# Patient Record
Sex: Male | Born: 1965 | Hispanic: No | Marital: Single | Smoking: Current some day smoker
Health system: Southern US, Community
[De-identification: ages and names within clinical notes are randomized; demographics above are authoritative.]

## PROBLEM LIST (undated history)

## (undated) DIAGNOSIS — I10 Essential (primary) hypertension: Secondary | ICD-10-CM

## (undated) HISTORY — PX: CERVICAL SPINE SURGERY: SHX589

## (undated) HISTORY — PX: HAND SURGERY: SHX662

---

## 2009-05-27 ENCOUNTER — Encounter: Admission: RE | Admit: 2009-05-27 | Discharge: 2009-05-27 | Payer: Self-pay | Admitting: Neurosurgery

## 2009-06-07 ENCOUNTER — Encounter: Admission: RE | Admit: 2009-06-07 | Discharge: 2009-06-07 | Payer: Self-pay | Admitting: Neurosurgery

## 2009-08-10 ENCOUNTER — Observation Stay (HOSPITAL_COMMUNITY): Admission: RE | Admit: 2009-08-10 | Discharge: 2009-08-11 | Payer: Self-pay | Admitting: Neurosurgery

## 2009-08-30 ENCOUNTER — Encounter: Admission: RE | Admit: 2009-08-30 | Discharge: 2009-08-30 | Payer: Self-pay | Admitting: Neurosurgery

## 2010-03-18 LAB — CBC
Hemoglobin: 17.1 g/dL — ABNORMAL HIGH (ref 13.0–17.0)
MCHC: 35.4 g/dL (ref 30.0–36.0)
MCV: 82.8 fL (ref 78.0–100.0)
Platelets: 221 10*3/uL (ref 150–400)
RBC: 5.83 MIL/uL — ABNORMAL HIGH (ref 4.22–5.81)
WBC: 7.3 10*3/uL (ref 4.0–10.5)

## 2010-03-18 LAB — BASIC METABOLIC PANEL
CO2: 26 mEq/L (ref 19–32)
Chloride: 109 mEq/L (ref 96–112)
Creatinine, Ser: 0.98 mg/dL (ref 0.4–1.5)
GFR calc non Af Amer: >60 mL/min (ref >60)
Potassium: 4.4 mEq/L (ref 3.5–5.1)
Sodium: 141 mEq/L (ref 135–145)

## 2011-05-01 ENCOUNTER — Ambulatory Visit: Payer: Self-pay | Admitting: Internal Medicine

## 2011-09-02 ENCOUNTER — Ambulatory Visit: Payer: Self-pay | Admitting: Internal Medicine

## 2012-11-12 ENCOUNTER — Ambulatory Visit: Payer: Self-pay | Admitting: Family

## 2012-11-29 ENCOUNTER — Ambulatory Visit: Payer: Self-pay | Admitting: Orthopedic Surgery

## 2013-04-16 ENCOUNTER — Emergency Department: Payer: Self-pay | Admitting: Internal Medicine

## 2013-04-17 ENCOUNTER — Ambulatory Visit: Payer: Self-pay | Admitting: Chiropractor

## 2013-06-05 ENCOUNTER — Emergency Department (HOSPITAL_COMMUNITY): Payer: Medicare Other

## 2013-06-05 ENCOUNTER — Encounter (HOSPITAL_COMMUNITY): Payer: Self-pay | Admitting: Emergency Medicine

## 2013-06-05 ENCOUNTER — Emergency Department (HOSPITAL_COMMUNITY)
Admission: EM | Admit: 2013-06-05 | Discharge: 2013-06-05 | Disposition: A | Discharge status: Home / Self Care | Payer: Medicare Other | Attending: Emergency Medicine | Admitting: Emergency Medicine

## 2013-06-05 DIAGNOSIS — Y929 Unspecified place or not applicable: Secondary | ICD-10-CM | POA: Insufficient documentation

## 2013-06-05 DIAGNOSIS — R5381 Other malaise: Secondary | ICD-10-CM | POA: Insufficient documentation

## 2013-06-05 DIAGNOSIS — F17200 Nicotine dependence, unspecified, uncomplicated: Secondary | ICD-10-CM | POA: Insufficient documentation

## 2013-06-05 DIAGNOSIS — R059 Cough, unspecified: Secondary | ICD-10-CM | POA: Diagnosis not present

## 2013-06-05 DIAGNOSIS — R079 Chest pain, unspecified: Secondary | ICD-10-CM | POA: Diagnosis not present

## 2013-06-05 DIAGNOSIS — Z79899 Other long term (current) drug therapy: Secondary | ICD-10-CM | POA: Diagnosis not present

## 2013-06-05 DIAGNOSIS — J3489 Other specified disorders of nose and nasal sinuses: Secondary | ICD-10-CM | POA: Diagnosis not present

## 2013-06-05 DIAGNOSIS — R5383 Other fatigue: Secondary | ICD-10-CM | POA: Diagnosis not present

## 2013-06-05 DIAGNOSIS — W57XXXA Bitten or stung by nonvenomous insect and other nonvenomous arthropods, initial encounter: Secondary | ICD-10-CM

## 2013-06-05 DIAGNOSIS — I10 Essential (primary) hypertension: Secondary | ICD-10-CM | POA: Diagnosis not present

## 2013-06-05 DIAGNOSIS — R05 Cough: Secondary | ICD-10-CM | POA: Insufficient documentation

## 2013-06-05 DIAGNOSIS — Y939 Activity, unspecified: Secondary | ICD-10-CM | POA: Insufficient documentation

## 2013-06-05 DIAGNOSIS — S00209A Unspecified superficial injury of unspecified eyelid and periocular area, initial encounter: Secondary | ICD-10-CM | POA: Insufficient documentation

## 2013-06-05 DIAGNOSIS — Z792 Long term (current) use of antibiotics: Secondary | ICD-10-CM | POA: Diagnosis not present

## 2013-06-05 HISTORY — DX: Essential (primary) hypertension: I10

## 2013-06-05 LAB — CBC WITH DIFFERENTIAL/PLATELET
Basophils Absolute: 0 10*3/uL (ref 0.0–0.1)
Basophils Relative: 1 % (ref 0–1)
EOS ABS: 0.4 10*3/uL (ref 0.0–0.7)
EOS PCT: 5 % (ref 0–5)
HCT: 42.1 % (ref 39.0–52.0)
HEMOGLOBIN: 14.7 g/dL (ref 13.0–17.0)
LYMPHS PCT: 35 % (ref 12–46)
Lymphs Abs: 3 10*3/uL (ref 0.7–4.0)
MCH: 29.2 pg (ref 26.0–34.0)
MCHC: 34.9 g/dL (ref 30.0–36.0)
MCV: 83.7 fL (ref 78.0–100.0)
MONO ABS: 0.6 10*3/uL (ref 0.1–1.0)
Monocytes Relative: 7 % (ref 3–12)
Neutro Abs: 4.5 10*3/uL (ref 1.7–7.7)
Neutrophils Relative %: 52 % (ref 43–77)
PLATELETS: 248 10*3/uL (ref 150–400)
RBC: 5.03 MIL/uL (ref 4.22–5.81)
RDW: 13.8 % (ref 11.5–15.5)
WBC: 8.6 10*3/uL (ref 4.0–10.5)

## 2013-06-05 LAB — I-STAT TROPONIN, ED
TROPONIN I, POC: 0.01 ng/mL (ref 0.00–0.08)
TROPONIN I, POC: 0 ng/mL (ref 0.00–0.08)

## 2013-06-05 LAB — BASIC METABOLIC PANEL
BUN: 13 mg/dL (ref 6–23)
CALCIUM: 9.3 mg/dL (ref 8.4–10.5)
CHLORIDE: 106 meq/L (ref 96–112)
CO2: 26 meq/L (ref 19–32)
CREATININE: 1 mg/dL (ref 0.50–1.35)
GFR calc non Af Amer: 89 mL/min — ABNORMAL LOW (ref >90)
GLUCOSE: 95 mg/dL (ref 70–99)
POTASSIUM: 3.7 meq/L (ref 3.7–5.3)
Sodium: 142 mEq/L (ref 137–147)

## 2013-06-05 MED ORDER — HYDROCHLOROTHIAZIDE 25 MG PO TABS
25.0000 mg | ORAL_TABLET | Freq: Once | ORAL | Status: AC
  Administered 2013-06-05: 25 mg via ORAL
  Filled 2013-06-05: qty 1

## 2013-06-05 MED ORDER — DOXYCYCLINE HYCLATE 100 MG PO CAPS: 100.0000 mg | ORAL_CAPSULE | Freq: Two times a day (BID) | ORAL | Status: DC

## 2013-06-05 MED ORDER — DOXYCYCLINE HYCLATE 100 MG PO TABS
100.0000 mg | ORAL_TABLET | Freq: Once | ORAL | Status: AC
  Administered 2013-06-05: 100 mg via ORAL
  Filled 2013-06-05: qty 1

## 2013-06-05 MED ORDER — BENZONATATE 100 MG PO CAPS
100.0000 mg | ORAL_CAPSULE | Freq: Once | ORAL | Status: AC
  Administered 2013-06-05: 100 mg via ORAL
  Filled 2013-06-05: qty 1

## 2013-06-05 NOTE — ED Notes (Signed)
Report given to Wendy, RN.

## 2013-06-05 NOTE — ED Notes (Signed)
Pt here today for c/o a tick on right eyelid. Found to have extremely high BP. Pt states that he has not taken his BP meds x 1 week because he was trying to find out which one might be giving him a cough. States he has had a sore throat, bilateral ear pain and cough x 69month. States he has had bodyaches, chest pain and occasional SOB over past 1 month but 'I just blew it off', thought it was probably my BP. States saw Dr. Harl Bowie in [place] 1 week ago but "I fired him".

## 2013-06-05 NOTE — Discharge Instructions (Signed)
°Emergency Department Resource Guide °1) Find a Doctor and Pay Out of Pocket °Although you won't have to find out who is covered by your insurance plan, it is a good idea to ask around and get recommendations. You will then need to call the office and see if the doctor you have chosen will accept you as a new patient and what types of options they offer for patients who are self-pay. Some doctors offer discounts or will set up payment plans for their patients who do not have insurance, but you will need to ask so you aren't surprised when you get to your appointment. ° °2) Contact Your Local Health Department °Not all health departments have doctors that can see patients for sick visits, but many do, so it is worth a call to see if yours does. If you don't know where your local health department is, you can check in your phone book. The CDC also has a tool to help you locate your state's health department, and many state websites also have listings of all of their local health departments. ° °3) Find a Walk-in Clinic °If your illness is not likely to be very severe or complicated, you may want to try a walk in clinic. These are popping up all over the country in pharmacies, drugstores, and shopping centers. They're usually staffed by nurse practitioners or physician assistants that have been trained to treat common illnesses and complaints. They're usually fairly quick and inexpensive. However, if you have serious medical issues or chronic medical problems, these are probably not your best option. ° °No Primary Care Doctor: °- Call [facility] at  832-8000 - they can help you locate a primary care doctor that  accepts your insurance, provides certain services, etc. °- Physician Referral Service- 1-800-533-3463 ° °Chronic Pain Problems: °Organization         Address  Phone   Notes  °[facility]  (336) 297-2271 Patients need to be referred by their primary care doctor.  ° °Medication  Assistance: °Organization         Address  Phone   Notes  °[place] [facility] [address] °[place], [place] [address] (336) 641-8030 --Must be a resident of [place] °-- Must have NO insurance coverage whatsoever (no Medicaid/ Medicare, etc.) °-- The pt. MUST have a primary care doctor that directs their care regularly and follows them in the community °  °[facility]  (866) 331-1348   °[employer]  (888) 892-1162   ° °Agencies that provide inexpensive medical care: °Organization         Address  Phone   Notes  °[facility]  (336) 832-8035   °[facility]    (336) 832-7272   °[facility] [address] °[place], [place] [address] (336) 832-4777   °[facility] [address], °[place] (336) 271-4999   °[facility]    (336) 373-0678   °[facility]    (336) 272-1050   °[facility] ° [address], [place] Phone:  (336) 832-4444, Fax:  (336) 832-4440 Hours of Operation:  9 am - 6 pm, M-F.  Also accepts Medicaid/Medicare and self-pay.  °[facility] ° [address], [place] Phone: (336) 832-3150, Fax: (336) [phone]. Hours of Operation:  8:30 am - 5:30 pm, M-F.  Also accepts Medicaid and self-pay.  °[facility] [place] [address], [place] Phone: (336) 878-6027   °[facility] [address], [place], [place] (336)723-1848, Ext. 123 Mondays & Thursdays: 7-9 AM.  First 15 patients are seen on a first come, first serve basis. °  ° °Medicaid-accepting [place] Providers: ° °Organization         Address  Phone   Notes  °[facility] [address], [place] (336) 641-2100 Also accepts self-pay patients.  °[facility] [address], [place] ° (336) 856-9996   °[facility] [address], [place]  (336) 288-8857   °[facility] [address] [place] [address], [place] (336) 299-7000   °Veita Bland [address], [place]  ° (336) 373-1557 Only accepts [place] Access Medicaid patients after they have their name applied to their card.  ° °Self-Pay (no insurance) in [place]: ° °Organization         Address  Phone   Notes  °Sickle Cell Patients, [facility] [address], [place] (336) 832-1970   °[facility] [address], [place] (336) 832-4400   °[facility] ° [address], [place] (336) 992-4800   °[facility]/Dr. Osei-Bonsu ° [address] [place] [address], [place] or [address], [place] (336) 841-8500 Phone number for both [place] and [place] locations is the same.  °[facility] [address], [place] (336) 299-0000   °[facility] [address] [place] [address], [place] or [address] (336) 852-7530 °(336) 878-2260   °[facility] [address], [place] (336) 350-1642, phone; (336) 294-5005, fax Sees patients 1st and 3rd Saturday of every month.  Must not qualify for public or private insurance (i.e. Medicaid, Medicare, [place] Health Choice, Veterans' Benefits) • Household income should be no more than 200% of the poverty level •The clinic cannot treat you if you are pregnant or think you are pregnant • Sexually transmitted diseases are not treated at the clinic.  ° ° °Dental Care: °Organization         Address  Phone  Notes  °[place] [facility] [address], [place] (336) 641-6152 Accepts children up to age 21 who are enrolled in Medicaid or [place] Health Choice; pregnant women with a Medicaid card; and children who have applied for Medicaid or [place] Health Choice, but were declined, whose parents can pay a reduced fee at time of service.  °[place]  [facility] [place]  [address], [place] (336) 641-7733 Accepts children up to age 21 who are enrolled in Medicaid or [place] Health Choice; pregnant women with a Medicaid card; and children who have applied for Medicaid or [place] Health Choice, but were declined, whose parents can pay a reduced fee at time of service.  °[facility] ° [address], [place] (336) 641-4533 Patients are seen by appointment only. Walk-ins are not accepted. [facility] will see patients 18 years of age and older. °Monday - Tuesday (8am-5pm) °Most Wednesdays (8:30-5pm) °$30 per visit, cash only  °[facility] ° [address], [place] (336) 641-4533 Patients are seen by appointment only. Walk-ins are not accepted. [facility] will see patients 18 years of age and older. °One   Wednesday Evening (Monthly: Volunteer Based).  $30 per visit, cash only  °[facility]  (919) 537-3737 for adults; Children under age 4, call Graduate Pediatric Dentistry at ([phone]) [phone]. Children aged 4-14, please call (919) [phone] to request a pediatric application. ° Dental services are provided in all areas of dental care including fillings, crowns and bridges, complete and partial dentures, implants, gum treatment, root canals, and extractions. Preventive care is also provided. Treatment is provided to both adults and children. °Patients are selected via a lottery and there is often a waiting list. °  °[facility] [address], °[place] ° (336) 763-8833 [email or web address] °  °[facility] [address], [place], [place] (336)723-1848, Ext. 123 Second and Fourth Thursday of each month, opens at 6:30 AM; Clinic ends at 9 AM.  Patients are seen on a first-come first-served basis, and a limited number are seen during each clinic.  ° °[facility] ° [address], [place], [place] (336) 723-7904    Eligibility Requirements °You must have lived in [place], [place], or [place] counties for at least the last three months. °  You cannot be eligible for state or federal sponsored healthcare insurance, including Veterans Administration, Medicaid, or Medicare. °  You generally cannot be eligible for healthcare insurance through your employer.  °  How to apply: °Eligibility screenings are held every Tuesday and Wednesday afternoon from 1:00 pm until 4:00 pm. You do not need an appointment for the interview!  °[facility] [address], [place], [place] 336-631-2330   °[facility]  336-342-8273   °[facility]  336-703-3100   °[facility]  336-570-6415   ° °Behavioral Health Resources in the Community: °Intensive Outpatient Programs °Organization         Address  Phone  Notes  °[facility] [address], [place], [place] 336-878-6098   °[facility] [address], [place], [place] 336-832-9800   °[facility] [address], [place], [place] ° 336-882-2125   °[facility] [address],  °[place], [place] 1-800-853-5163 or 336-641-4981   °Substance Abuse Resources °Organization         Address  Phone  Notes  °[facility]  336-882-2125   °[facility]  336-784-9470   °[facility]  336-285-9073   °[facility]  336-845-3988   °Residential & Outpatient Substance Abuse Program  1-800-659-3381   °Psychological Services °Organization         Address  Phone  Notes  °[facility]  336- 832-9600   °[facility]  336- 378-7881   °[facility] [address], [place] 1-800-853-5163 or 336-641-4981   ° °Mobile Crisis Teams °Organization         Address  Phone  Notes  °[facility], Mobile Crisis Care Unit  1-877-626-1772   °Assertive °[facility] ° [address]  [place], [place] 336-834-9664   °Sharon DeEsch [address] °[place] 336-554-5454   ° °Self-Help/Support Groups °Organization         Address  Phone             Notes  °[facility] of  - variety of support groups  336- 373-1402 Call for more information  °Narcotics Anonymous (NA), [facility] [address], °[place]  2 meetings at this location  ° °  Residential Treatment Programs Organization         Address  Phone  Notes  [facility] [address],    [place]  [phone]   [facility]  [address], [place] [phone], [place], [place] [phone]   [facility] [address], [place] [phone] Admissions: 8am-3pm M-F  [facility] [address]    [employer] [place], [place] 841-324-4010(989)[phone]   [facility] [address], [place], [place] [phone]   [facility] [address]  [place], [place] [phone]   [facility] Intensive Outpatient [address], [place], [place] [phone]   [facility] ([facility] [address]  [place], [place] [phone] or (425) [phone]   [facility] [address] [place], [place] [phone] Accepts Medicaid  [facility] [address]  [place] [phone] Substance Abuse/Addiction Treatment   [facility]         Address  Phone  Notes  [facility]  737-654-7455(888) 956-645-1729   Angie FavaJulie Brannon, PhD [address], [address] [place], [place]   [phone](336) [phone] or [phone](336) [phone]   [facility]   [address] [place], [place] ([phone]) [phone](336) [phone]   [facility] [address], [place], [place] ([phone](336) [phone] Insurance/Medicaid/sponsorship through [facility]  [facility] [address]                                    [place], [place] [phone]) [phone] Therapy/tele-psych/case    [facility] [address]   [place], [place] [phone](336) [phone]    Dr. Lolly MustacheArfeen  [phone](336) [phone]   [facility]  [employer] [facility] 1) [address], [place] 2) [address], [place] 3)  [address], [place] [phone](336) [phone](336) [phone]  [phone](336) [phone]   [facility] Child Abuse Hotline [phone](336) 816-592-6400 or [phone](336) [phone] (After Hours)       Chest Pain (Nonspecific) It is often hard to give a specific diagnosis for the cause of chest pain. There is always a chance that your pain could be related to something serious, such as a heart attack or a blood clot in the lungs. You need to follow up with your caregiver for further evaluation. CAUSES   Heartburn.  Pneumonia or bronchitis.  Anxiety or stress.  Inflammation around your heart (pericarditis) or lung (pleuritis or pleurisy).  A blood clot in the lung.  A collapsed lung (pneumothorax). It can develop suddenly on its own (spontaneous pneumothorax) or from injury (trauma) to the chest.  Shingles infection (herpes zoster virus). The chest wall is composed of bones, muscles, and cartilage. Any of these can be the source of the pain.  The bones can be bruised by injury.  The muscles or cartilage can be strained by coughing or overwork.  The cartilage can be affected by inflammation and become sore (costochondritis). DIAGNOSIS  Lab tests or other studies, such as X-rays, electrocardiography, stress testing, or cardiac imaging, may be needed to find the cause of your pain.  TREATMENT   Treatment depends on what may be causing your chest pain. Treatment may include:  Acid blockers for heartburn.  Anti-inflammatory medicine.  Pain medicine for inflammatory conditions.  Antibiotics if an infection is present.  You may  be advised to change lifestyle habits. This includes stopping smoking and avoiding alcohol, caffeine, and chocolate.  You may be advised to keep your  head raised (elevated) when sleeping. This reduces the chance of acid going backward from your stomach into your esophagus.  Most of the time, nonspecific chest pain will improve within 2 to 3 days with rest and mild pain medicine. HOME CARE INSTRUCTIONS   If antibiotics were prescribed, take your antibiotics as directed. Finish them even if you start to feel better.  For the next few days, avoid physical activities that bring on chest pain. Continue physical activities as directed.  Do not smoke.  Avoid drinking alcohol.  Only take over-the-counter or prescription medicine for pain, discomfort, or fever as directed by your caregiver.  Follow your caregiver's suggestions for further testing if your chest pain does not go away.  Keep any follow-up appointments you made. If you do not go to an appointment, you could develop lasting (chronic) problems with pain. If there is any problem keeping an appointment, you must call to reschedule. SEEK MEDICAL CARE IF:   You think you are having problems from the medicine you are taking. Read your medicine instructions carefully.  Your chest pain does not go away, even after treatment.  You develop a rash with blisters on your chest. SEEK IMMEDIATE MEDICAL CARE IF:   You have increased chest pain or pain that spreads to your arm, neck, jaw, back, or abdomen.  You develop shortness of breath, an increasing cough, or you are coughing up blood.  You have severe back or abdominal pain, feel nauseous, or vomit.  You develop severe weakness, fainting, or chills.  You have a fever. THIS IS AN EMERGENCY. Do not wait to see if the pain will go away. Get medical help at once. Call your local emergency services (911 in [place] Do not drive yourself to the hospital. MAKE SURE YOU:   Understand these instructions.  Will watch your condition.  Will get help right away if you are not doing well or get worse. Document Released: 09/28/2004 Document  Revised: 03/13/2011 Document Reviewed: 07/25/2007 [facility] Patient Information 2014 [place], [place].  Tick Bite Information Ticks are insects that attach themselves to the skin and draw blood for food. There are various types of ticks. Common types include wood ticks and deer ticks. Most ticks live in shrubs and grassy areas. Ticks can climb onto your body when you make contact with leaves or grass where the tick is waiting. The most common places on the body for ticks to attach themselves are the scalp, neck, armpits, waist, and groin. Most tick bites are harmless, but sometimes ticks carry germs that cause diseases. These germs can be spread to a person during the tick's feeding process. The chance of a disease spreading through a tick bite depends on:   The type of tick.  Time of year.   How long the tick is attached.   Geographic location.  HOW CAN YOU PREVENT TICK BITES? Take these steps to help prevent tick bites when you are outdoors:  Wear protective clothing. Long sleeves and long pants are best.   Wear white clothes so you can see ticks more easily.  Tuck your pant legs into your socks.   If walking on a trail, stay in the middle of the trail to avoid brushing against bushes.  Avoid walking through areas with long grass.  Put insect repellent on all exposed skin and along boot tops, pant  legs, and sleeve cuffs.   Check clothing, hair, and skin repeatedly and before going inside.   Brush off any ticks that are not attached.  Take a shower or bath as soon as possible after being outdoors.  WHAT IS THE PROPER WAY TO REMOVE A TICK? Ticks should be removed as soon as possible to help prevent diseases caused by tick bites. 1. If latex gloves are available, put them on before trying to remove a tick.  2. Using fine-point tweezers, grasp the tick as close to the skin as possible. You may also use curved forceps or a tick removal tool. Grasp the tick as close to its  head as possible. Avoid grasping the tick on its body. 3. Pull gently with steady upward pressure until the tick lets go. Do not twist the tick or jerk it suddenly. This may break off the tick's head or mouth parts. 4. Do not squeeze or crush the tick's body. This could force disease-carrying fluids from the tick into your body.  5. After the tick is removed, wash the bite area and your hands with soap and water or other disinfectant such as alcohol. 6. Apply a small amount of antiseptic cream or ointment to the bite site.  7. Wash and disinfect any instruments that were used.  Do not try to remove a tick by applying a hot match, petroleum jelly, or fingernail polish to the tick. These methods do not work and may increase the chances of disease being spread from the tick bite.  WHEN SHOULD YOU SEEK MEDICAL CARE? Contact your health care provider if you are unable to remove a tick from your skin or if a part of the tick breaks off and is stuck in the skin.  After a tick bite, you need to be aware of signs and symptoms that could be related to diseases spread by ticks. Contact your health care provider if you develop any of the following in the days or weeks after the tick bite:  Unexplained fever.  Rash. A circular rash that appears days or weeks after the tick bite may indicate the possibility of Lyme disease. The rash may resemble a target with a bull's-eye and may occur at a different part of your body than the tick bite.  Redness and swelling in the area of the tick bite.   Tender, swollen lymph glands.   Diarrhea.   Weight loss.   Cough.   Fatigue.   Muscle, joint, or bone pain.   Abdominal pain.   Headache.   Lethargy or a change in your level of consciousness.  Difficulty walking or moving your legs.   Numbness in the legs.   Paralysis.  Shortness of breath.   Confusion.   Repeated vomiting.  Document Released: 12/17/1999 Document Revised:  10/09/2012 Document Reviewed: 05/29/2012 [facility] Patient Information 2014 [place], [place].

## 2013-06-05 NOTE — ED Notes (Signed)
Phlebotomy at bedside.

## 2013-06-05 NOTE — ED Provider Notes (Signed)
CSN: [id number]     Arrival date & time 06/05/13  1407 History   First MD Initiated Contact with Patient 06/05/13 1519     Chief Complaint  Patient presents with  . Hypertension  . Tick Removal     (Consider location/radiation/quality/duration/timing/severity/associated sxs/prior Treatment) Patient is a 48 y.o. male presenting with general illness. The history is provided by the patient.  Illness Severity:  Moderate Onset quality:  Gradual Timing:  Constant Progression:  Unchanged Chronicity:  New Associated symptoms: chest pain (chest soreness. especially after days of heavy working. non-radiating. no associated n/v or diaphoresis. no sob), congestion, cough, fatigue, headaches (mild frontal) and rhinorrhea   Associated symptoms: no abdominal pain, no diarrhea, no fever, no loss of consciousness, no nausea, no rash, no shortness of breath and no vomiting     48 yo male with tick to right eyelid. Noticed today. Coming to ED for removal.  On further discussion, patient with generalized fatigue, dry cough, sore throat, and b/l ear pain x1 month. Waxing and waning. Took himself off antihypertensives 1 week ago to see if it would help.  Does endorse chest soreness over past couple weeks. Worse with palpation and twisting motion. Not worse with exertion. Denies any  SOB to me.  Patient states that finding the tick today reminded him of removing tick from left chest wall about one month ago. Removed entirely  (including head). States that maybe these symptoms are from that.  States he plans to go back on antihypertensives today.  No palpitations. No edema.     Past Medical History  Diagnosis Date  . Hypertension    Past Surgical History  Procedure Laterality Date  . Cervical spine surgery    . Hand surgery     No family history on file. History  Substance Use Topics  . Smoking status: Current Some Day Smoker  . Smokeless tobacco: Not on file  . Alcohol Use: No    Review of  Systems  Constitutional: Positive for fatigue. Negative for fever and chills.  HENT: Positive for congestion and rhinorrhea.   Eyes: Negative for pain and visual disturbance.  Respiratory: Positive for cough. Negative for shortness of breath.   Cardiovascular: Positive for chest pain (chest soreness. especially after days of heavy working. non-radiating. no associated n/v or diaphoresis. no sob). Negative for palpitations and leg swelling.  Gastrointestinal: Negative for nausea, vomiting, abdominal pain and diarrhea.  Genitourinary: Negative for flank pain and difficulty urinating.  Musculoskeletal: Negative for back pain.  Skin: Negative for color change and rash.  Neurological: Positive for headaches (mild frontal). Negative for dizziness and loss of consciousness.  All other systems reviewed and are negative.     Allergies  Review of patient's allergies indicates no known allergies.  Home Medications   Prior to Admission medications   Medication Sig Start Date End Date Taking? Authorizing Provider  amLODipine (NORVASC) 5 MG tablet Take 5 mg by mouth daily. 05/06/13  Yes Historical Provider, MD  hydrochlorothiazide (HYDRODIURIL) 25 MG tablet Take 25 mg by mouth daily.   Yes Historical Provider, MD  KLOR-CON 10 10 MEQ tablet Take 10 tablets by mouth daily. 05/12/13  Yes Historical Provider, MD  metoprolol succinate (TOPROL-XL) 100 MG 24 hr tablet Take 100 mg by mouth daily. 05/06/13  Yes Historical Provider, MD  doxycycline (VIBRAMYCIN) 100 MG capsule Take 1 capsule (100 mg total) by mouth 2 (two) times daily. One po bid x7 days 06/05/13   Stevie Kernyan Zaul Hubers, MD  BP 179/109  Pulse 76  Temp(Src) 98.8 F (37.1 C) (Oral)  Resp 17  SpO2 98% Physical Exam  Nursing note and vitals reviewed. Constitutional: He is oriented to person, place, and time. He appears well-developed and well-nourished. No distress.  HENT:  Head: Normocephalic and atraumatic. Head is without right periorbital erythema  and without left periorbital erythema.  Right Ear: Tympanic membrane, external ear and ear canal normal. No tenderness. No foreign bodies. No mastoid tenderness.  Left Ear: Tympanic membrane, external ear and ear canal normal. No tenderness. No foreign bodies. No mastoid tenderness.  Nose: Nose normal.  Mouth/Throat: Uvula is midline, oropharynx is clear and moist and mucous membranes are normal. No trismus in the jaw. No uvula swelling. No posterior oropharyngeal erythema or tonsillar abscesses.  Eyes: Conjunctivae and EOM are normal. Pupils are equal, round, and reactive to light. Right eye exhibits no discharge. Left eye exhibits no discharge.    Neck: No tracheal deviation present.  Cardiovascular: Normal heart sounds and intact distal pulses.   Pulmonary/Chest: Effort normal and breath sounds normal. No stridor. No respiratory distress. He has no wheezes. He has no rales.  Abdominal: Soft. He exhibits no distension. There is no tenderness. There is no guarding.  Musculoskeletal: He exhibits no edema and no tenderness.  Neurological: He is alert and oriented to person, place, and time. He has normal strength. No cranial nerve deficit or sensory deficit. Coordination and gait normal. GCS eye subscore is 4. GCS verbal subscore is 5. GCS motor subscore is 6.  Skin: Skin is warm and dry.  Psychiatric: He has a normal mood and affect. His behavior is normal.    ED Course  Procedures (including critical care time) Labs Review Labs Reviewed  BASIC METABOLIC PANEL - Abnormal; Notable for the following:    GFR calc non Af Amer 89 (*)    All other components within normal limits  CBC WITH DIFFERENTIAL  I-STAT TROPOININ, ED  Rosezena Sensor, ED    Imaging Review Dg Chest 2 View  06/05/2013   CLINICAL DATA:  Take by.  Cough.  EXAM: CHEST  2 VIEW  COMPARISON:  08/03/2009  FINDINGS: Mild cardiomegaly. Clear lungs. Normal lung volumes. No pneumothorax or pleural effusion.  IMPRESSION: No active  cardiopulmonary disease.   Electronically Signed   By: Maryclare Bean M.D.   On: 06/05/2013 16:46     EKG Interpretation None       Date: 06/05/2013  Rate: 65  Rhythm: normal sinus rhythm  QRS Axis: normal  Intervals: normal  ST/T Wave abnormalities: nonspecific T wave changes  Conduction Disutrbances:none  Narrative Interpretation:   Old EKG Reviewed: none available    MDM   Final diagnoses:  Fatigue  Chest pain  Tick bite    48 yo male with tick to eye lid.  Also with hypertension after self discontinuation of antihypertensives.  Well appearing on exam. Initial BP >200 systolic. Improved on own to 170's systolic on my exam. Later increased so given home dose of HCTZ. Improved after. Without evidence of end organ damage. Neuro intact. Did endorse some chest discomfort that sound musculoskeletal. Not exertional. Low suspicion for ACS. Troponin and delta troponin negative.  Given multiple tick exposure and generalized fatigue and other vague complaints. Treat with doxy for possible tick borne illness.  To restart BP meds. Patient in agreement.  Patient discharged home. Return precautions given. To follow up with pcp. patient in agreement with plan.   Discussed case with Dr. Micheline Maze  who is in agreement with assessment and plan.    Stevie Kern, MD 06/06/13 [phone]

## 2013-06-05 NOTE — ED Notes (Signed)
Roxy Cedar, MD notified re: BP readings

## 2013-06-05 NOTE — ED Notes (Signed)
MD at bedside to remove tick. 

## 2013-06-06 NOTE — ED Provider Notes (Signed)
Medical screening examination/treatment/procedure(s) were conducted as a shared visit with resident-physician practitioner(s) and myself.  I personally evaluated the patient during the encounter.  Pt is a 48 y.o. male with pmhx as above presenting with tick on R eyelid, as well as HTN and multiple vague systemic complaints including several weeks of intermittent CP.  He has recently chose to stop his BP meds to see if symptoms improved. Pt found to have no ischemic EKG changes, negative delta trop, nml Cr.  Tick removed by Dr. Sheilah Mins.  Pt instructed to restart home meds, f/u closely w/ PCP. As this is 2nd tick in past month pt has removed and multiple systemic complaints with treat prophylactically fro RMSF w/ doxy.     EKG Interpretation  Date/Time:  Thursday June 05 2013 15:59:57 EDT Ventricular Rate:  65 PR Interval:  158 QRS Duration: 96 QT Interval:  411 QTC Calculation: 427 R Axis:   59 Text Interpretation:  Sinus rhythm Probable left ventricular hypertrophy No prior fro comparison Confirmed by DOCHERTY  MD, MEGAN ([id number]) on 06/06/2013 3:32:48 PM        Shanna Cisco, MD 06/06/13 1536

## 2015-06-05 IMAGING — CR DG CHEST 2V
2 series · 2 of 2 positions shown · non-contrast
Comparison: 08/03/2009

CLINICAL DATA: Take by.  Cough.

EXAM:
CHEST  2 VIEW

[w chest pa]
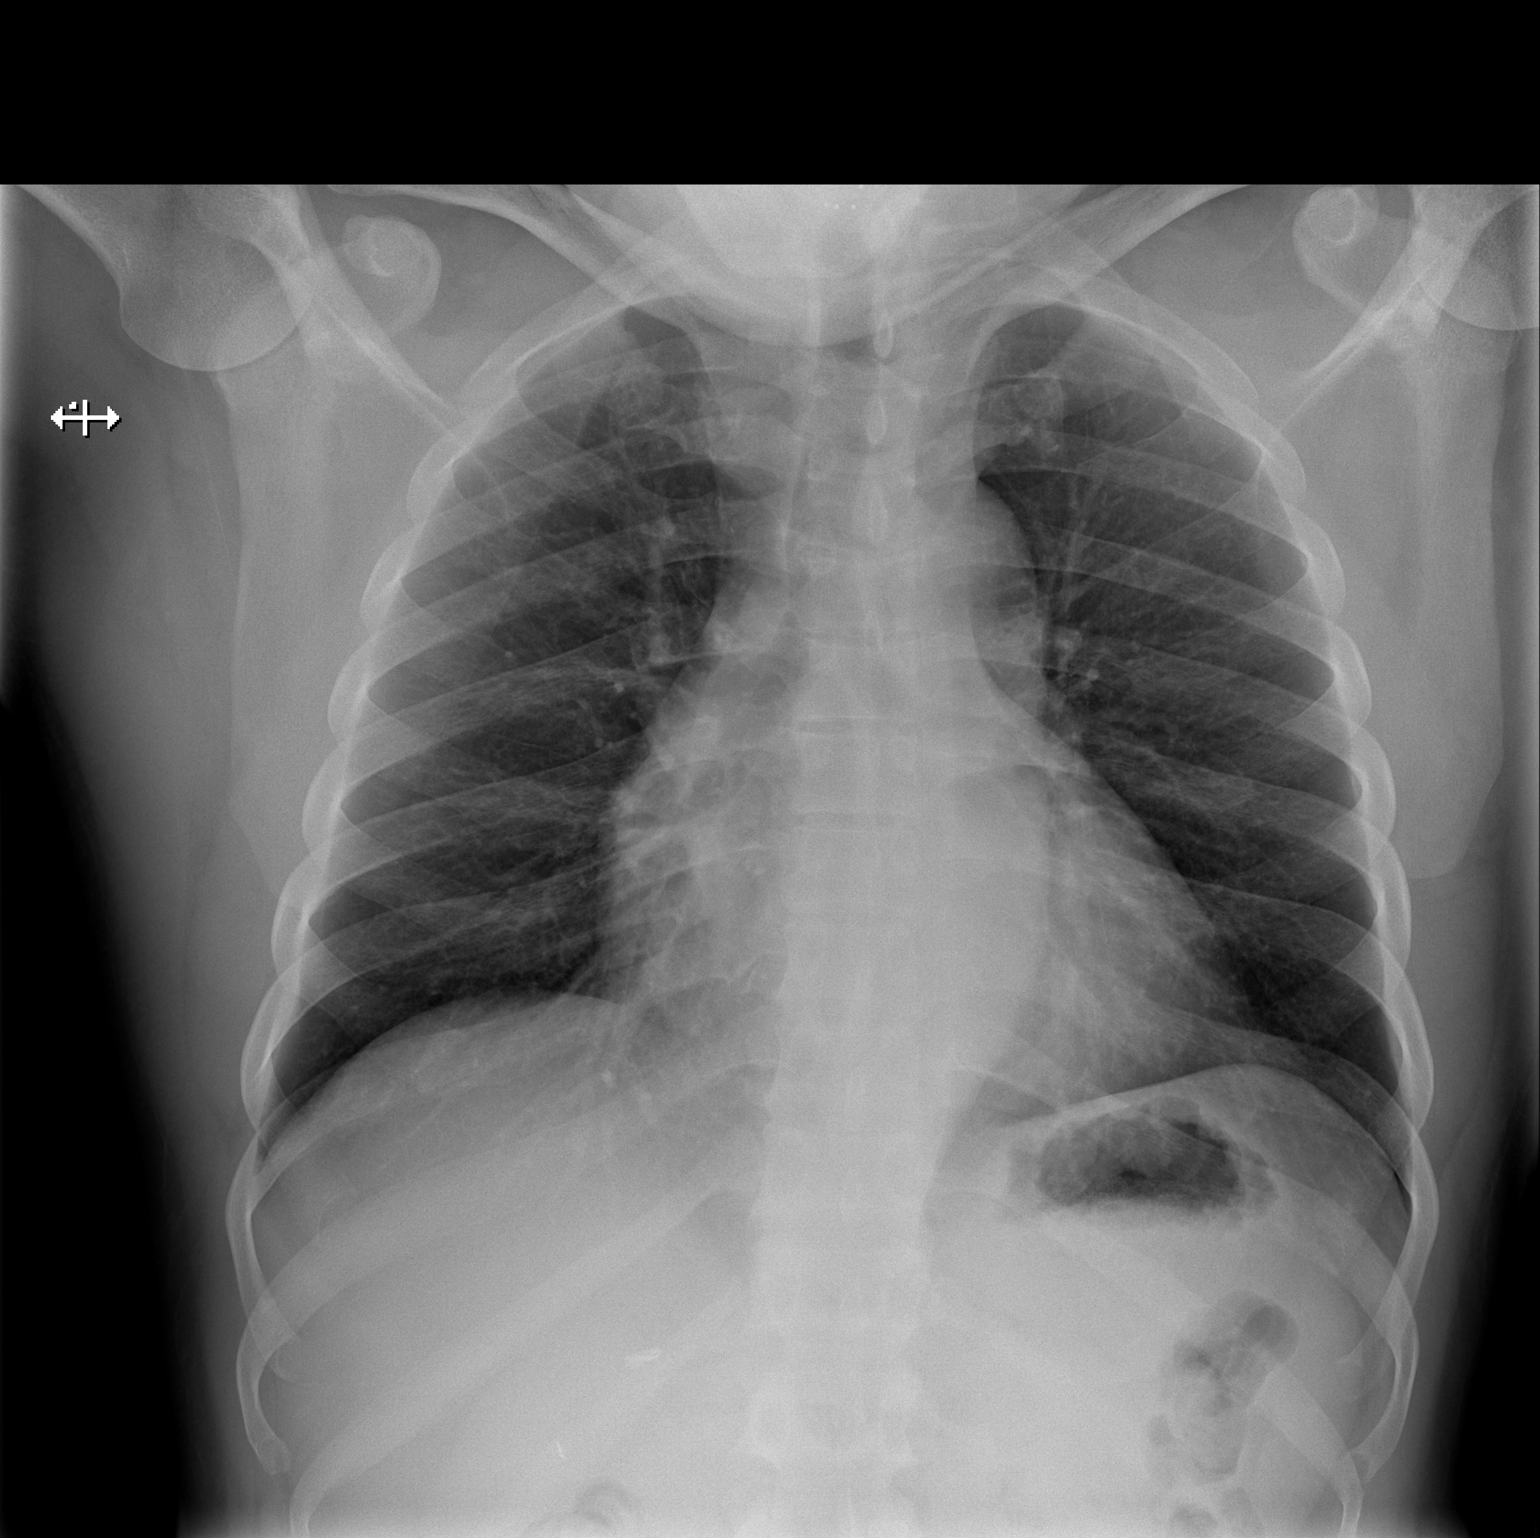

[w chest lat]
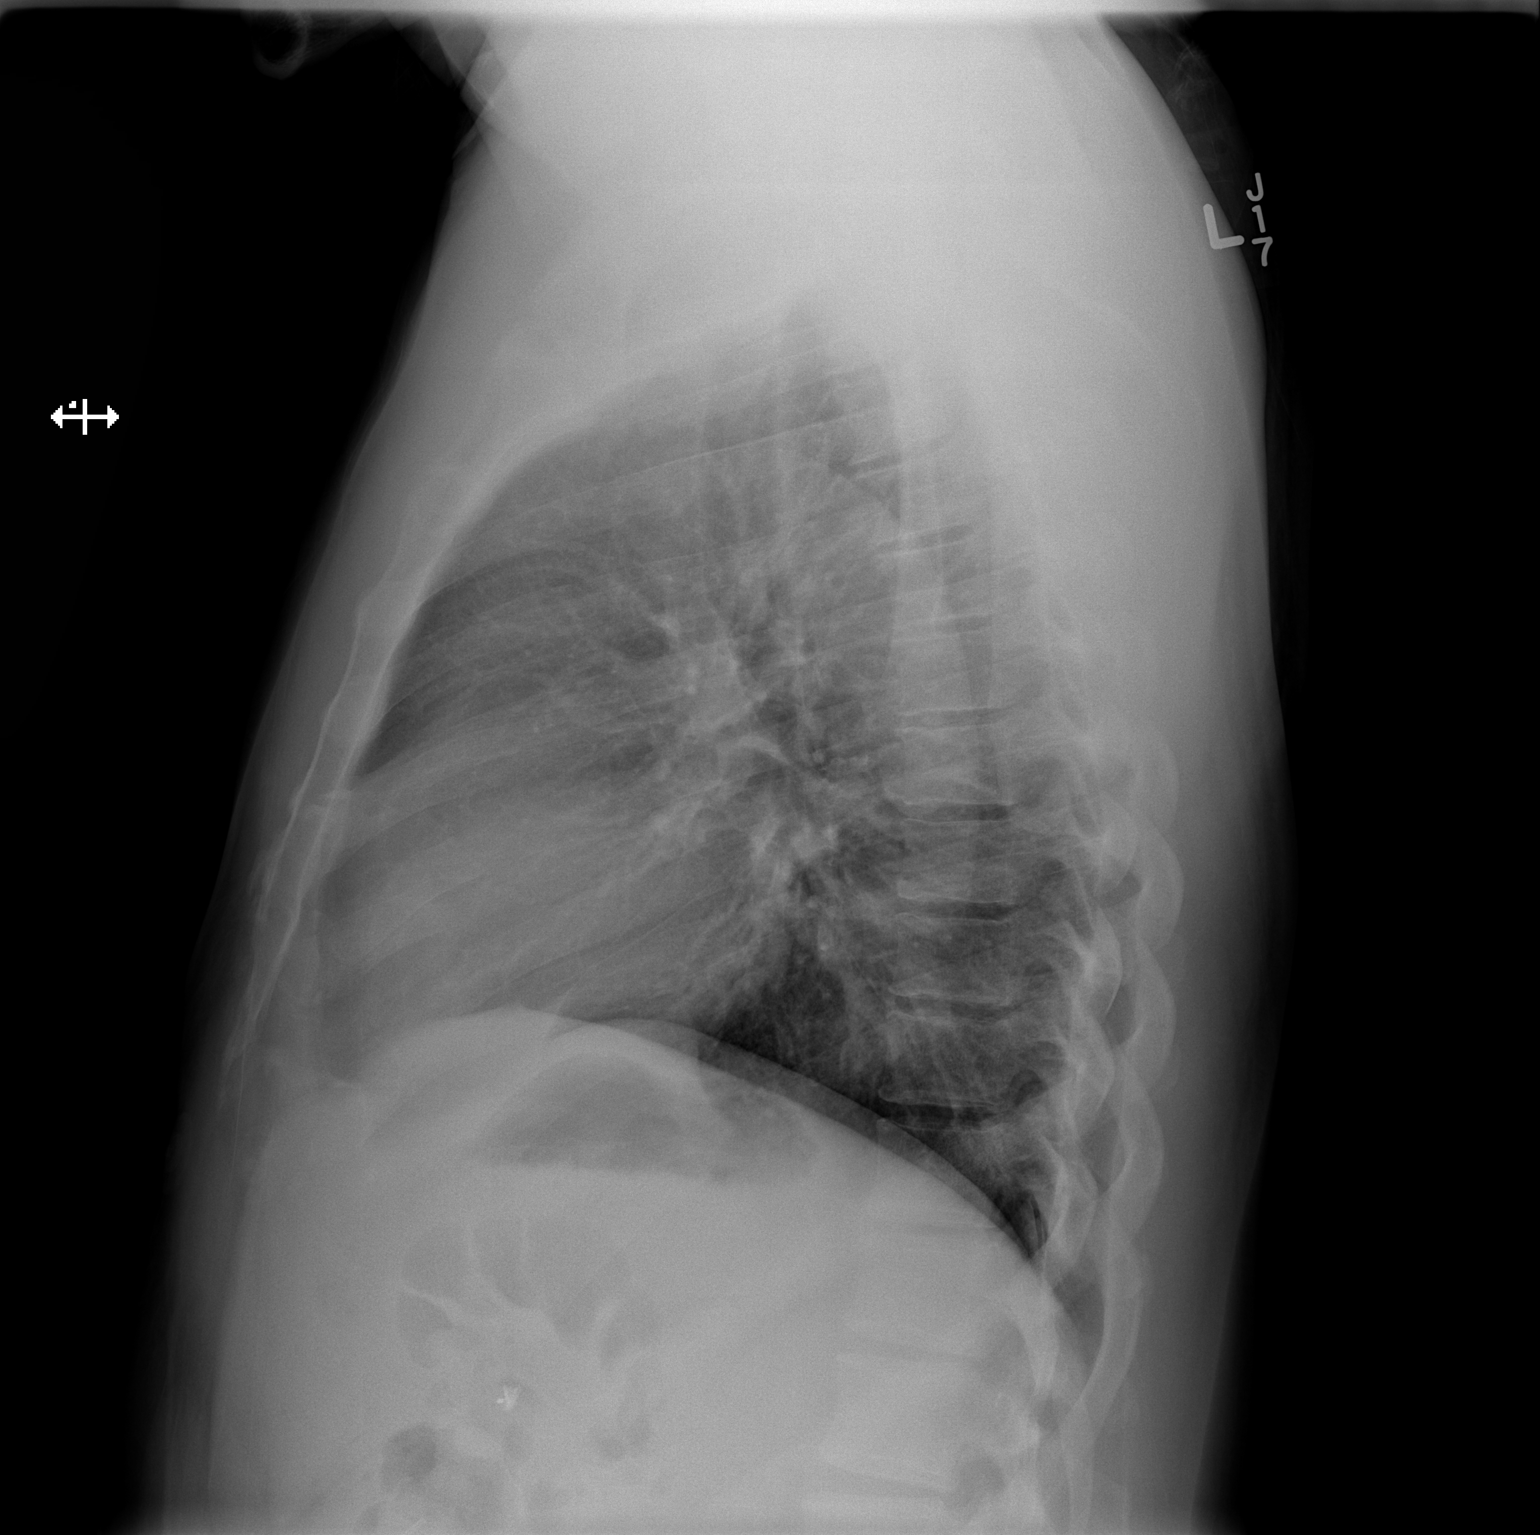

[2 of 2 positions shown; findings below may reference images not displayed]

FINDINGS: Mild cardiomegaly. Clear lungs. Normal lung volumes. No pneumothorax
or pleural effusion.
IMPRESSION: No active cardiopulmonary disease.

## 2016-06-12 ENCOUNTER — Encounter (HOSPITAL_COMMUNITY): Payer: Self-pay

## 2016-06-12 ENCOUNTER — Emergency Department (HOSPITAL_COMMUNITY): Payer: Medicare Other

## 2016-06-12 ENCOUNTER — Emergency Department (HOSPITAL_COMMUNITY)
Admission: EM | Admit: 2016-06-12 | Discharge: 2016-06-12 | Disposition: A | Discharge status: Home / Self Care | Payer: Medicare Other | Attending: Emergency Medicine | Admitting: Emergency Medicine

## 2016-06-12 DIAGNOSIS — Z79899 Other long term (current) drug therapy: Secondary | ICD-10-CM | POA: Insufficient documentation

## 2016-06-12 DIAGNOSIS — I10 Essential (primary) hypertension: Secondary | ICD-10-CM | POA: Diagnosis not present

## 2016-06-12 DIAGNOSIS — F17200 Nicotine dependence, unspecified, uncomplicated: Secondary | ICD-10-CM | POA: Insufficient documentation

## 2016-06-12 DIAGNOSIS — M542 Cervicalgia: Secondary | ICD-10-CM | POA: Diagnosis not present

## 2016-06-12 MED ORDER — PREDNISONE 10 MG PO TABS: 10.0000 mg | ORAL_TABLET | Freq: Every day | ORAL | 0 refills | Status: DC

## 2016-06-12 MED ORDER — OXYCODONE-ACETAMINOPHEN 5-325 MG PO TABS: 1.0000 | ORAL_TABLET | ORAL | 0 refills | Status: DC | PRN

## 2016-06-12 MED ORDER — CYCLOBENZAPRINE HCL 10 MG PO TABS: 10.0000 mg | ORAL_TABLET | Freq: Two times a day (BID) | ORAL | 0 refills | Status: DC | PRN

## 2016-06-12 NOTE — ED Notes (Signed)
Care handoff to Ellen RN 

## 2016-06-12 NOTE — Discharge Instructions (Signed)
Prescription for pain medicine, muscle relaxer, prednisone. Follow-up with the neurosurgeon.
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
## 2016-06-12 NOTE — ED Notes (Signed)
PT reports he took himself off of BP meds 2.5 months ago. PT has been monitoring BP at home. PT reports his BP has been elevated for 1.5 months. PT is also consuming more salt. PT reports no symptoms at this time. PT states he still has BP meds at home and "will take some today."
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
## 2016-06-27 ENCOUNTER — Other Ambulatory Visit: Payer: Self-pay | Admitting: Neurosurgery

## 2016-06-27 DIAGNOSIS — M5412 Radiculopathy, cervical region: Secondary | ICD-10-CM

## 2016-07-01 ENCOUNTER — Ambulatory Visit
Admission: RE | Admit: 2016-07-01 | Discharge: 2016-07-01 | Disposition: A | Discharge status: Home / Self Care | Payer: Medicare Other | Source: Ambulatory Visit | Attending: Neurosurgery | Admitting: Neurosurgery

## 2016-07-01 DIAGNOSIS — M5023 Other cervical disc displacement, cervicothoracic region: Secondary | ICD-10-CM | POA: Diagnosis not present

## 2016-07-01 DIAGNOSIS — M5412 Radiculopathy, cervical region: Secondary | ICD-10-CM | POA: Diagnosis present

## 2016-07-01 DIAGNOSIS — M4802 Spinal stenosis, cervical region: Secondary | ICD-10-CM | POA: Diagnosis not present

## 2016-07-01 DIAGNOSIS — Z981 Arthrodesis status: Secondary | ICD-10-CM | POA: Diagnosis not present

## 2016-07-21 ENCOUNTER — Encounter (HOSPITAL_COMMUNITY): Payer: Self-pay | Admitting: *Deleted

## 2016-07-21 ENCOUNTER — Emergency Department (HOSPITAL_COMMUNITY)
Admission: EM | Admit: 2016-07-21 | Discharge: 2016-07-21 | Disposition: A | Discharge status: Home / Self Care | Payer: Medicare Other | Attending: Emergency Medicine | Admitting: Emergency Medicine

## 2016-07-21 DIAGNOSIS — M542 Cervicalgia: Secondary | ICD-10-CM | POA: Diagnosis present

## 2016-07-21 DIAGNOSIS — Z79899 Other long term (current) drug therapy: Secondary | ICD-10-CM | POA: Diagnosis not present

## 2016-07-21 DIAGNOSIS — R29898 Other symptoms and signs involving the musculoskeletal system: Secondary | ICD-10-CM | POA: Diagnosis not present

## 2016-07-21 DIAGNOSIS — I10 Essential (primary) hypertension: Secondary | ICD-10-CM | POA: Diagnosis not present

## 2016-07-21 DIAGNOSIS — F17200 Nicotine dependence, unspecified, uncomplicated: Secondary | ICD-10-CM | POA: Insufficient documentation

## 2016-07-21 DIAGNOSIS — M5020 Other cervical disc displacement, unspecified cervical region: Secondary | ICD-10-CM
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
## 2016-07-21 NOTE — Discharge Instructions (Signed)
I spoke with the person on-call for Dr. Dawson Billsabell tonight. They reassured me you would be contacted early in the week.

## 2016-07-21 NOTE — ED Provider Notes (Signed)
[facility]-EMERGENCY DEPT Provider Note   CSN: [id number] Arrival date & time: 07/21/16  1447     History   Chief Complaint Chief Complaint  Patient presents with  . Neck Pain    HPI Raymond Valenzuela is a 51 y.o. male.  Patient has had persistent neck pain for several weeks. He now has worsening weakness and numbness in his right hand in digits 5, 4, one half of 3.  He has similar symptoms in the left hand but not as severe. He is seen by a local neurosurgeon Dr. Mikal Valenzuela, but no formal terminal recommendation has been made. Patient is here tonight with concerns about seen the specialist. No other neurological deficits.      Past Medical History:  Diagnosis Date  . Hypertension     There are no active problems to display for this patient.   Past Surgical History:  Procedure Laterality Date  . CERVICAL SPINE SURGERY    . HAND SURGERY         Home Medications    Prior to Admission medications   Medication Sig Start Date End Date Taking? Authorizing Provider  acetaminophen (TYLENOL) 500 MG tablet Take 500 mg by mouth every 6 (six) hours as needed (for headaches or pain).    Yes [provider]  amLODipine (NORVASC) 10 MG tablet Take 10 mg by mouth daily.   Yes [provider]  chlorthalidone (HYGROTON) 25 MG tablet Take 25 mg by mouth daily.   Yes [provider]  losartan (COZAAR) 50 MG tablet Take 50 mg by mouth daily.   Yes [provider]  metoprolol succinate (TOPROL-XL) 100 MG 24 hr tablet Take 100 mg by mouth daily. 05/06/13  Yes [provider]  potassium chloride (K-DUR,KLOR-CON) 10 MEQ tablet Take 10 mEq by mouth daily.   Yes [provider]  cyclobenzaprine (FLEXERIL) 10 MG tablet Take 1 tablet (10 mg total) by mouth 2 (two) times daily as needed for muscle spasms. Patient not taking: Reported on 07/21/2016 06/12/16   Donnetta Hutchingook, Scotti Kosta, MD  oxyCODONE-acetaminophen (PERCOCET) 5-325 MG tablet Take 1-2 tablets by mouth  every 4 (four) hours as needed. Patient not taking: Reported on 07/21/2016 06/12/16   Donnetta Hutchingook, Genea Rheaume, MD  predniSONE (DELTASONE) 10 MG tablet Take 1 tablet (10 mg total) by mouth daily with breakfast. 2 tablets for 5 days; one tablet for 5 days Patient not taking: Reported on 07/21/2016 06/12/16   Donnetta Hutchingook, Baxter Gonzalez, MD    Family History History reviewed. No pertinent family history.  Social History Social History  Substance Use Topics  . Smoking status: Current Some Day Smoker  . Smokeless tobacco: Never Used  . Alcohol use No     Allergies   Lisinopril   Review of Systems Review of Systems  All other systems reviewed and are negative.    Physical Exam Updated Vital Signs BP (!) 147/102 (BP Location: Left Arm)   Pulse 65   Temp 98.8 F (37.1 C) (Oral)   Resp 16   Ht 5\' 8"  (1.727 m)   Wt 90.7 kg (200 lb)   SpO2 98%   BMI 30.41 kg/m   Physical Exam  Constitutional: He appears well-developed and well-nourished.  HENT:  Head: Normocephalic and atraumatic.  Eyes: Conjunctivae are normal.  Neck: Neck supple.  Cardiovascular: Normal rate and regular rhythm.   Pulmonary/Chest: Effort normal and breath sounds normal.  Abdominal: Soft. Bowel sounds are normal.  Musculoskeletal: Normal range of motion.  Neurological: He is alert.  Numbness in digits 5, 4, half of 3; right hand greater than left hand  Skin: Skin is warm and dry.  Psychiatric: He has a normal mood and affect. His behavior is normal.  Nursing note and vitals reviewed.    ED Treatments / Results  Labs (all labs ordered are listed, but only abnormal results are displayed) Labs Reviewed - No data to display  EKG  EKG Interpretation None       Radiology No results found.  Procedures Procedures (including critical care time)  Medications Ordered in ED Medications - No data to display   Initial Impression / Assessment and Plan / ED Course  I have reviewed the triage vital signs and the nursing  notes.  Pertinent labs & imaging results that were available during my care of the patient were reviewed by me and considered in my medical decision making (see chart for details).     I spoke to the physician assistant on-call for Dr. Mikal Plane. He reassured me that the patient would be notified early in the week for a formal office consultation. I discussed this with the patient and his wife.  Final Clinical Impressions(s) / ED Diagnoses   Final diagnoses:  Right hand weakness  Neck pain  HNP (herniated nucleus pulposus), cervical    New Prescriptions New Prescriptions   No medications on file     Donnetta Hutching, MD 07/21/16 1954

## 2016-08-07 ENCOUNTER — Other Ambulatory Visit: Payer: Self-pay | Admitting: Neurosurgery

## 2016-08-07 DIAGNOSIS — M5010 Cervical disc disorder with radiculopathy, unspecified cervical region: Secondary | ICD-10-CM

## 2016-08-07 DIAGNOSIS — M5020 Other cervical disc displacement, unspecified cervical region: Secondary | ICD-10-CM

## 2016-08-16 ENCOUNTER — Ambulatory Visit
Admission: RE | Admit: 2016-08-16 | Discharge: 2016-08-16 | Disposition: A | Discharge status: Home / Self Care | Payer: Medicare Other | Source: Ambulatory Visit | Attending: Neurosurgery | Admitting: Neurosurgery

## 2016-08-16 DIAGNOSIS — M5010 Cervical disc disorder with radiculopathy, unspecified cervical region: Secondary | ICD-10-CM

## 2016-08-16 DIAGNOSIS — M5020 Other cervical disc displacement, unspecified cervical region: Secondary | ICD-10-CM

## 2016-08-16 MED ORDER — ONDANSETRON HCL 4 MG/2ML IJ SOLN
4.0000 mg | Freq: Once | INTRAMUSCULAR | Status: AC
  Administered 2016-08-16: 4 mg via INTRAMUSCULAR

## 2016-08-16 MED ORDER — DIAZEPAM 5 MG PO TABS
10.0000 mg | ORAL_TABLET | Freq: Once | ORAL | Status: AC
  Administered 2016-08-16: 5 mg via ORAL

## 2016-08-16 MED ORDER — MEPERIDINE HCL 100 MG/ML IJ SOLN
75.0000 mg | Freq: Once | INTRAMUSCULAR | Status: AC
  Administered 2016-08-16: 75 mg via INTRAMUSCULAR

## 2016-08-16 MED ORDER — ONDANSETRON HCL 4 MG/2ML IJ SOLN: 4.0000 mg | Freq: Four times a day (QID) | INTRAMUSCULAR | Status: DC | PRN

## 2016-08-16 MED ORDER — IOPAMIDOL (ISOVUE-M 300) INJECTION 61%
10.0000 mL | Freq: Once | INTRAMUSCULAR | Status: AC | PRN
  Administered 2016-08-16: 10 mL via INTRATHECAL

## 2016-08-16 NOTE — Discharge Instructions (Signed)

## 2016-08-21 ENCOUNTER — Other Ambulatory Visit: Payer: Self-pay | Admitting: Neurosurgery

## 2016-09-15 NOTE — Pre-Procedure Instructions (Signed)
Raymond Valenzuela  09/15/2016      [id number] - [place], [place] - [address] [place] [address] Phone: [phone] Fax: [phone]  [id number] - [place], [place] - [address] [place] [address] Phone: [phone] Fax: [phone]    Your procedure is scheduled on September 21, 2016.  Report to [facility] Admitting at 1050 AM.  Call this number if you have problems the morning of surgery:  [phone]   Remember:  Do not eat food or drink liquids after midnight.  Take these medicines the morning of surgery with A SIP OF WATER acetaminophen (tylenol), amlodipine (norvasc), metoprolol (toprol XL).  7 days prior to surgery STOP taking any Aspirin, Aleve, Naproxen, Ibuprofen, Motrin, Advil, Goody's, BC's, all herbal medications, fish oil, and all vitamins   Do not wear jewelry, make-up or nail polish.  Do not wear lotions, powders, or perfumes, or deoderant.  Men may shave face and neck.  Do not bring valuables to the hospital.  [facility] is not responsible for any belongings or valuables.  Contacts, dentures or bridgework may not be worn into surgery.  Leave your suitcase in the car.  After surgery it may be brought to your room.  For patients admitted to the hospital, discharge time will be determined by your treatment team.  Patients discharged the day of surgery will not be allowed to drive home.   Special instructions:   [facility]- Preparing For Surgery  Before surgery, you can play an important role. Because skin is not sterile, your skin needs to be as free of germs as possible. You can reduce the number of germs on your skin by washing with CHG (chlorahexidine gluconate) Soap before surgery.  CHG is an antiseptic cleaner which kills germs and bonds with the skin to continue killing germs even after washing.  Please do not use if you have an allergy to CHG or antibacterial soaps. If  your skin becomes reddened/irritated stop using the CHG.  Do not shave (including legs and underarms) for at least 48 hours prior to first CHG shower. It is OK to shave your face.  Please follow these instructions carefully.   1. Shower the NIGHT BEFORE SURGERY and the MORNING OF SURGERY with CHG.   2. If you chose to wash your hair, wash your hair first as usual with your normal shampoo.  3. After you shampoo, rinse your hair and body thoroughly to remove the shampoo.  4. Use CHG as you would any other liquid soap. You can apply CHG directly to the skin and wash gently with a scrungie or a clean washcloth.   5. Apply the CHG Soap to your body ONLY FROM THE NECK DOWN.  Do not use on open wounds or open sores. Avoid contact with your eyes, ears, mouth and genitals (private parts). Wash genitals (private parts) with your normal soap.  6. Wash thoroughly, paying special attention to the area where your surgery will be performed.  7. Thoroughly rinse your body with warm water from the neck down.  8. DO NOT shower/wash with your normal soap after using and rinsing off the CHG Soap.  9. Pat yourself dry with a CLEAN TOWEL.   10. Wear CLEAN PAJAMAS   11. Place CLEAN SHEETS on your bed the night of your first shower and DO NOT SLEEP WITH PETS.    Day of Surgery: Do not  apply any deodorants/lotions. Please wear clean clothes to the hospital/surgery center.     Please read over the following fact sheets that you were given. Pain Booklet, Coughing and Deep Breathing, Total Joint Packet and MRSA Information

## 2016-09-18 ENCOUNTER — Encounter (HOSPITAL_COMMUNITY): Payer: Self-pay

## 2016-09-18 ENCOUNTER — Encounter (HOSPITAL_COMMUNITY)
Admission: RE | Admit: 2016-09-18 | Discharge: 2016-09-18 | Disposition: A | Discharge status: Home / Self Care | Payer: Medicare Other | Source: Ambulatory Visit | Attending: Neurosurgery | Admitting: Neurosurgery

## 2016-09-18 DIAGNOSIS — M5013 Cervical disc disorder with radiculopathy, cervicothoracic region: Secondary | ICD-10-CM | POA: Diagnosis not present

## 2016-09-18 DIAGNOSIS — Z888 Allergy status to other drugs, medicaments and biological substances status: Secondary | ICD-10-CM | POA: Diagnosis not present

## 2016-09-18 DIAGNOSIS — Z79899 Other long term (current) drug therapy: Secondary | ICD-10-CM | POA: Diagnosis not present

## 2016-09-18 DIAGNOSIS — I10 Essential (primary) hypertension: Secondary | ICD-10-CM | POA: Diagnosis present

## 2016-09-18 DIAGNOSIS — Z01812 Encounter for preprocedural laboratory examination: Secondary | ICD-10-CM | POA: Insufficient documentation

## 2016-09-18 DIAGNOSIS — Z981 Arthrodesis status: Secondary | ICD-10-CM | POA: Diagnosis not present

## 2016-09-18 DIAGNOSIS — F17200 Nicotine dependence, unspecified, uncomplicated: Secondary | ICD-10-CM | POA: Diagnosis present

## 2016-09-18 LAB — CBC
HCT: 48.3 % (ref 39.0–52.0)
Hemoglobin: 16.9 g/dL (ref 13.0–17.0)
MCH: 29.3 pg (ref 26.0–34.0)
MCHC: 35 g/dL (ref 30.0–36.0)
MCV: 83.9 fL (ref 78.0–100.0)
PLATELETS: 232 10*3/uL (ref 150–400)
RBC: 5.76 MIL/uL (ref 4.22–5.81)
RDW: 13.8 % (ref 11.5–15.5)
WBC: 9.1 10*3/uL (ref 4.0–10.5)

## 2016-09-18 LAB — BASIC METABOLIC PANEL
ANION GAP: 6 (ref 5–15)
BUN: 13 mg/dL (ref 6–20)
CALCIUM: 9.9 mg/dL (ref 8.9–10.3)
CO2: 25 mmol/L (ref 22–32)
Chloride: 107 mmol/L (ref 101–111)
Creatinine, Ser: 1.31 mg/dL — ABNORMAL HIGH (ref 0.61–1.24)
GLUCOSE: 109 mg/dL — AB (ref 65–99)
POTASSIUM: 4.2 mmol/L (ref 3.5–5.1)
Sodium: 138 mmol/L (ref 135–145)

## 2016-09-18 LAB — SURGICAL PCR SCREEN
MRSA, PCR: NEGATIVE
Staphylococcus aureus: NEGATIVE

## 2016-09-18 MED ORDER — CHLORHEXIDINE GLUCONATE CLOTH 2 % EX PADS: 6.0000 | MEDICATED_PAD | Freq: Once | CUTANEOUS | Status: DC

## 2016-09-18 NOTE — Progress Notes (Addendum)
PCP: Phineas Real, MD  Cardiologist: pt denies   EKG: 06/29/16 at [facility], in Care Everywhere  Stress test: 10+ years  ECHO: pt denies ever  Cardiac Cath: pt denies ever  Chest x-ray: 06/29/16 at [facility], in Care Everywhere

## 2016-09-19 NOTE — Progress Notes (Addendum)
Anesthesia Chart Review: Patient is a 51 year old male scheduled for laminectomy C7-T1 right on 09/21/16 by [facility].  History includes smoking, HTN, C6-7 ACDF 08/10/09, hand surgery. - (According to [facility] Records in Care Everywhere): Planned admission ([facility]) for hypertensive emergency 06/29/16 in the setting of medication non-compliance (X 7-8 months due to periods of hypotension). He took BP at [facility] and then called result to PCP who referred him to the ED. BP in the ED was 182/119. He also reported intermittent chest pain with nausea. EKG showed LVH and CXR showed enlarged cardiac silhouette suggestive of LVH. Troponin was normal. Last LHC in 2011 showed no CAD. According to H&P, "Stress test tomorrow if serial troponins are negative. Defer Echo for now, consider cardiac stress echo tomorrow. Defer Cardiology consult for now." Patient left AMA.   - PCP is with [facility].  - He saw cardiologist [facility] with [facility] (Care Everywhere) in 2016 for uncontrolled HTN. He had been admitted to [facility] for chest pain with SBP 220.    Meds include amlodipine 10 mg daily, chlorthalidone 25 mg daily, losartan 50 mg daily, Toprol XL  daily, KCl 10 mEq daily.   BP (!) 146/103   Pulse 66   Temp 37.2 C   Resp 20   Ht  (1.727 m)   Wt 193 lb 14.4 oz (88 kg)   SpO2 97%   BMI 29.48 kg/m    EKG 06/29/16 ([facility]): Normal sinus rhythm. Left atrial enlargement. Left ventricular hypertrophy with repolarization abnormality. When compared to 06/05/13 tracing, repolarization abnormality worse and ST/T wave abnormality more prominent.  Echo 12/23/13 [facility]; Care Everywhere): - Left ventricular is normal in size with moderate-severely increased wall thickness, and normal contraction. The visually estimated left ventricular ejection fraction 60-65%. Diastolic transmitral flow profile and mitral annular tissue  Doppler signal characteristic of decreased left ventricular chamber compliance with elevated filling pressures. Septal wall is thicker than posterior wall, raising concern for hypertrophic cardiomyopathy. No evidence of LVOT obstruction. - Trivial mitral regurgitation.  - Left atrium is mildly dilated.  - Aortic valve is trileaflet with normal excursion. No evidence of aortic regurgitation. No evidence of hemodynamically important aortic transvalvular gradient (the maximal instantaneous left ventricular outflow velocity is 1.3 m/s).  - The thoracic aorta is mildly dilated (3.8 cm at the sinuses of Valsalva, and 2.9 cm at the transverse arch). - Thereis no evidence ofhemodynamicallyimportant pulmonary transvalvulargradient;the maximal instantaneousrightventricular outflowvelocity is approximately0.9 m/s. - There is normal right ventricular chamber size, wall thickness and contraction.  - Trivial tricuspid regurgitation. Right ventricular and pulmonary arterial systolic pressure could not be accurately estimated from this examination.  - Inferior vena cava is dilated with decreased phasic respiratory change characteristic of moderately elevated central venous and right arterial pressures; the estimated central venous and right atrial pressures are 15-20 mmHg.   Stress test/imaging 09/05/11 [facility]; Care Everywhere): Impressions: - Normal myocardial perfusion study. - No evidence for significant ischemia or scar is noted. - Global systolic function is normal. The EF is greater than 65%. - No significant coronary calcifications were noted on the attenuation-correction CT.   Left Cardiac Catheterization 12/20/2009 [facility]; report as outlined in 06/29/16 H&P): Summary Findings: 1. No angiographic evidence of significant coronary artery disease. 2. Elevated left ventricular filling pressures (LVEDP = 26 mm Hg) associated with poorly controlled hypertension. 3. Normal left ventricular systolic  function with an estimated ejection fraction of >70%.  4. There is no angiographic evidence of hemodynamically significant renal artery disease.  Carotid cerebral angiogram 06/23/09 ([facility]; Care Everywhere): Impression: Significant bilateral vertebrobasilar dolichoectasia. There is also significant tortuosity of bilateral intracranial ICAs. No flow-limiting stenosis, aneurysm, or dissection is seen.  CXR 06/29/16 [facility]; Care Everywhere): FINDINGS:  Radiographically clear lungs. No pleural effusion or pneumothorax. Heart appears enlarged, but stable.  Impression: Clear lungs.   Preoperative labs noted. Glucose 109. BUN 13, Cr 1.31 (previous Cr 1.10 on 06/30/16). CBC WNL.   Reviewed above with anesthesiologist [facility]. According to 06/29/16 H&P, patient was to be scheduled for stress echo for recent evaluation for hypertensive emergency and chest pain; however, patient left AMA. Would recommend preoperative stress test or referral to cardiology for preoperative evaluation (and defer decision for further testing to cardiologist). I have notified [facility] at [facility] office of recommendations. I also reported patient's PAT BP. Although BP better than June, a significantly elevated BP on the day of surgery could also effect surgery plans. Lowella Bandy will call and discuss with patient.   [facility]Anesthesiology Phone [phone] 09/19/2016 1:33 PM  ADDENDUM: Anesthesiologist [facility] called patient this morning at his request. Patient feels there are inaccuracies in his [facility] 06/29/16 notes. He is not having active CV symptoms. He has been off pain medications and feels this is contributing to his hypertension. [facility] discussed that he would still like for [facility] to bee seen by a cardiologist for a preoperative risk assessment, but would defer decision for any preoperative testing to cardiology. I called to see if Dr. Yates Decamp  could him today. Patient can be seen at [facility] at 2:00 PM. Patient notified to arrive at 1:30 PM and bring insurance card, photo ID, and medications. Raymond Valenzuela plans to call Dr. Jacinto Halim as well to update. I have updated [facility] at [facility] office. I will fax my note for Dr. Jacinto Halim to review. (UPDATE: 09/20/16 1:46 PM. Patient was seen today by Dr. Jacinto Halim. EKG was repeated there which showed negative T waves most notable in V5-6 and I and aVL. Office note is pending, but I did receive a signed form indicating, "Patient can be taken up for surgery with low risk for perioperative CV complication." I updated Raymond [facility]a.)  [facility]Anesthesiology Phone [phone] 09/20/2016 11:05 AM

## 2016-09-21 ENCOUNTER — Ambulatory Visit (HOSPITAL_COMMUNITY): Payer: Medicare Other

## 2016-09-21 ENCOUNTER — Inpatient Hospital Stay (HOSPITAL_COMMUNITY)
Admission: RE | Admit: 2016-09-21 | Discharge: 2016-09-22 | DRG: 517 | Disposition: A | Discharge status: Home / Self Care | Payer: Medicare Other | Source: Ambulatory Visit | Attending: Neurosurgery | Admitting: Neurosurgery

## 2016-09-21 ENCOUNTER — Encounter (HOSPITAL_COMMUNITY): Payer: Self-pay | Admitting: *Deleted

## 2016-09-21 ENCOUNTER — Ambulatory Visit (HOSPITAL_COMMUNITY): Payer: Medicare Other | Admitting: Anesthesiology

## 2016-09-21 ENCOUNTER — Ambulatory Visit (HOSPITAL_COMMUNITY): Payer: Medicare Other | Admitting: Vascular Surgery

## 2016-09-21 ENCOUNTER — Encounter (HOSPITAL_COMMUNITY)
Admission: RE | Disposition: A | Discharge status: Home / Self Care | Payer: Self-pay | Source: Ambulatory Visit | Attending: Neurosurgery

## 2016-09-21 DIAGNOSIS — F17200 Nicotine dependence, unspecified, uncomplicated: Secondary | ICD-10-CM | POA: Diagnosis present

## 2016-09-21 DIAGNOSIS — I10 Essential (primary) hypertension: Secondary | ICD-10-CM | POA: Diagnosis not present

## 2016-09-21 DIAGNOSIS — Z79899 Other long term (current) drug therapy: Secondary | ICD-10-CM

## 2016-09-21 DIAGNOSIS — Z888 Allergy status to other drugs, medicaments and biological substances status: Secondary | ICD-10-CM

## 2016-09-21 DIAGNOSIS — Z981 Arthrodesis status: Secondary | ICD-10-CM

## 2016-09-21 DIAGNOSIS — M5020 Other cervical disc displacement, unspecified cervical region: Secondary | ICD-10-CM | POA: Diagnosis present

## 2016-09-21 DIAGNOSIS — Z419 Encounter for procedure for purposes other than remedying health state, unspecified: Secondary | ICD-10-CM

## 2016-09-21 DIAGNOSIS — M5013 Cervical disc disorder with radiculopathy, cervicothoracic region: Secondary | ICD-10-CM | POA: Diagnosis present

## 2016-09-21 HISTORY — PX: POSTERIOR CERVICAL LAMINECTOMY: SHX2248

## 2016-09-21 SURGERY — POSTERIOR CERVICAL LAMINECTOMY
Anesthesia: General | Site: Back | Laterality: Right

## 2016-09-21 MED ORDER — ROCURONIUM BROMIDE 10 MG/ML (PF) SYRINGE
PREFILLED_SYRINGE | INTRAVENOUS | Status: AC
Start: 2016-09-21 — End: 2016-09-21
  Filled 2016-09-21: qty 5

## 2016-09-21 MED ORDER — OXYCODONE HCL ER 10 MG PO T12A
10.0000 mg | EXTENDED_RELEASE_TABLET | Freq: Two times a day (BID) | ORAL | Status: DC
  Administered 2016-09-21 – 2016-09-22 (×2): 10 mg via ORAL
  Filled 2016-09-21 (×2): qty 1

## 2016-09-21 MED ORDER — MIDAZOLAM HCL 2 MG/2ML IJ SOLN
INTRAMUSCULAR | Status: AC
  Filled 2016-09-21: qty 2

## 2016-09-21 MED ORDER — PROPOFOL 10 MG/ML IV BOLUS
INTRAVENOUS | Status: AC
  Filled 2016-09-21: qty 20

## 2016-09-21 MED ORDER — FENTANYL CITRATE (PF) 100 MCG/2ML IJ SOLN
INTRAMUSCULAR | Status: AC
  Filled 2016-09-21: qty 2

## 2016-09-21 MED ORDER — DIAZEPAM 5 MG PO TABS
ORAL_TABLET | ORAL | Status: AC
  Filled 2016-09-21: qty 1

## 2016-09-21 MED ORDER — PHENYLEPHRINE 40 MCG/ML (10ML) SYRINGE FOR IV PUSH (FOR BLOOD PRESSURE SUPPORT)
PREFILLED_SYRINGE | INTRAVENOUS | Status: AC
  Filled 2016-09-21: qty 10

## 2016-09-21 MED ORDER — PROPOFOL 10 MG/ML IV BOLUS
INTRAVENOUS | Status: DC | PRN
  Administered 2016-09-21: 200 mg via INTRAVENOUS

## 2016-09-21 MED ORDER — FENTANYL CITRATE (PF) 250 MCG/5ML IJ SOLN
INTRAMUSCULAR | Status: AC
  Filled 2016-09-21: qty 5

## 2016-09-21 MED ORDER — LACTATED RINGERS IV SOLN
INTRAVENOUS | Status: DC
  Administered 2016-09-21 (×2): via INTRAVENOUS

## 2016-09-21 MED ORDER — HYDROMORPHONE HCL 1 MG/ML IJ SOLN
INTRAMUSCULAR | Status: AC
  Administered 2016-09-21: 0.5 mg via INTRAVENOUS
  Filled 2016-09-21: qty 1

## 2016-09-21 MED ORDER — SUGAMMADEX SODIUM 200 MG/2ML IV SOLN
INTRAVENOUS | Status: DC | PRN
  Administered 2016-09-21: 200 mg via INTRAVENOUS

## 2016-09-21 MED ORDER — DIAZEPAM 5 MG PO TABS
5.0000 mg | ORAL_TABLET | Freq: Four times a day (QID) | ORAL | Status: DC | PRN
  Administered 2016-09-21: 5 mg via ORAL

## 2016-09-21 MED ORDER — PROMETHAZINE HCL 25 MG/ML IJ SOLN: 6.2500 mg | INTRAMUSCULAR | Status: DC | PRN

## 2016-09-21 MED ORDER — CEFAZOLIN SODIUM-DEXTROSE 2-4 GM/100ML-% IV SOLN
INTRAVENOUS | Status: AC
  Filled 2016-09-21: qty 100

## 2016-09-21 MED ORDER — ROCURONIUM BROMIDE 100 MG/10ML IV SOLN
INTRAVENOUS | Status: DC | PRN
  Administered 2016-09-21: 60 mg via INTRAVENOUS

## 2016-09-21 MED ORDER — SODIUM CHLORIDE 0.9% FLUSH
3.0000 mL | Freq: Two times a day (BID) | INTRAVENOUS | Status: DC
  Administered 2016-09-21: 3 mL via INTRAVENOUS

## 2016-09-21 MED ORDER — THROMBIN 5000 UNITS EX SOLR
OROMUCOSAL | Status: DC | PRN
  Administered 2016-09-21: 5 mL via TOPICAL

## 2016-09-21 MED ORDER — PHENOL 1.4 % MT LIQD: 1.0000 | OROMUCOSAL | Status: DC | PRN

## 2016-09-21 MED ORDER — OXYCODONE HCL 5 MG PO TABS: 5.0000 mg | ORAL_TABLET | ORAL | Status: DC | PRN

## 2016-09-21 MED ORDER — ZOLPIDEM TARTRATE 5 MG PO TABS: 5.0000 mg | ORAL_TABLET | Freq: Every evening | ORAL | Status: DC | PRN

## 2016-09-21 MED ORDER — POTASSIUM CHLORIDE IN NACL 20-0.9 MEQ/L-% IV SOLN: INTRAVENOUS | Status: DC

## 2016-09-21 MED ORDER — SODIUM CHLORIDE 0.9 % IV SOLN: 250.0000 mL | INTRAVENOUS | Status: DC

## 2016-09-21 MED ORDER — METHYLPREDNISOLONE ACETATE 80 MG/ML IJ SUSP
INTRAMUSCULAR | Status: AC
  Filled 2016-09-21: qty 1

## 2016-09-21 MED ORDER — MORPHINE SULFATE (PF) 4 MG/ML IV SOLN: 1.0000 mg | INTRAVENOUS | Status: DC | PRN

## 2016-09-21 MED ORDER — THROMBIN 5000 UNITS EX SOLR
CUTANEOUS | Status: AC
  Filled 2016-09-21: qty 15000

## 2016-09-21 MED ORDER — SUGAMMADEX SODIUM 200 MG/2ML IV SOLN
INTRAVENOUS | Status: AC
  Filled 2016-09-21: qty 2

## 2016-09-21 MED ORDER — 0.9 % SODIUM CHLORIDE (POUR BTL) OPTIME
TOPICAL | Status: DC | PRN
  Administered 2016-09-21: 1000 mL

## 2016-09-21 MED ORDER — LIDOCAINE 2% (20 MG/ML) 5 ML SYRINGE
INTRAMUSCULAR | Status: AC
  Filled 2016-09-21: qty 5

## 2016-09-21 MED ORDER — AMLODIPINE BESYLATE 10 MG PO TABS
10.0000 mg | ORAL_TABLET | Freq: Every day | ORAL | Status: DC
  Administered 2016-09-21 – 2016-09-22 (×2): 10 mg via ORAL
  Filled 2016-09-21 (×2): qty 1

## 2016-09-21 MED ORDER — SENNOSIDES-DOCUSATE SODIUM 8.6-50 MG PO TABS: 1.0000 | ORAL_TABLET | Freq: Every evening | ORAL | Status: DC | PRN

## 2016-09-21 MED ORDER — METOPROLOL SUCCINATE ER 25 MG PO TB24
100.0000 mg | ORAL_TABLET | Freq: Every day | ORAL | Status: DC
  Administered 2016-09-22: 100 mg via ORAL
  Filled 2016-09-21: qty 4

## 2016-09-21 MED ORDER — LIDOCAINE HCL (CARDIAC) 20 MG/ML IV SOLN
INTRAVENOUS | Status: DC | PRN
  Administered 2016-09-21: 100 mg via INTRAVENOUS

## 2016-09-21 MED ORDER — KETOROLAC TROMETHAMINE 30 MG/ML IJ SOLN
30.0000 mg | Freq: Once | INTRAMUSCULAR | Status: DC | PRN
  Administered 2016-09-21: 30 mg via INTRAVENOUS

## 2016-09-21 MED ORDER — KETOROLAC TROMETHAMINE 30 MG/ML IJ SOLN
INTRAMUSCULAR | Status: AC
  Filled 2016-09-21: qty 1

## 2016-09-21 MED ORDER — DEXAMETHASONE SODIUM PHOSPHATE 10 MG/ML IJ SOLN
INTRAMUSCULAR | Status: AC
  Filled 2016-09-21: qty 1

## 2016-09-21 MED ORDER — HYDROMORPHONE HCL 1 MG/ML IJ SOLN
0.2500 mg | INTRAMUSCULAR | Status: DC | PRN
  Administered 2016-09-21 (×2): 0.5 mg via INTRAVENOUS

## 2016-09-21 MED ORDER — SUCCINYLCHOLINE CHLORIDE 200 MG/10ML IV SOSY
PREFILLED_SYRINGE | INTRAVENOUS | Status: AC
  Filled 2016-09-21: qty 10

## 2016-09-21 MED ORDER — VANCOMYCIN HCL 1000 MG IV SOLR
INTRAVENOUS | Status: AC
  Filled 2016-09-21: qty 1000

## 2016-09-21 MED ORDER — MIDAZOLAM HCL 5 MG/5ML IJ SOLN
INTRAMUSCULAR | Status: DC | PRN
  Administered 2016-09-21: 2 mg via INTRAVENOUS

## 2016-09-21 MED ORDER — LIDOCAINE-EPINEPHRINE 0.5 %-1:200000 IJ SOLN
INTRAMUSCULAR | Status: AC
  Filled 2016-09-21: qty 1

## 2016-09-21 MED ORDER — PHENYLEPHRINE HCL 10 MG/ML IJ SOLN
INTRAVENOUS | Status: DC | PRN
  Administered 2016-09-21: 20 ug/min via INTRAVENOUS

## 2016-09-21 MED ORDER — DOCUSATE SODIUM 100 MG PO CAPS
100.0000 mg | ORAL_CAPSULE | Freq: Two times a day (BID) | ORAL | Status: DC
  Administered 2016-09-21 – 2016-09-22 (×2): 100 mg via ORAL
  Filled 2016-09-21 (×2): qty 1

## 2016-09-21 MED ORDER — FENTANYL CITRATE (PF) 100 MCG/2ML IJ SOLN
50.0000 ug | Freq: Once | INTRAMUSCULAR | Status: AC
  Administered 2016-09-21: 50 ug via INTRAVENOUS

## 2016-09-21 MED ORDER — CHLORTHALIDONE 25 MG PO TABS
25.0000 mg | ORAL_TABLET | Freq: Every day | ORAL | Status: DC
  Administered 2016-09-22: 25 mg via ORAL
  Filled 2016-09-21: qty 1

## 2016-09-21 MED ORDER — FENTANYL CITRATE (PF) 100 MCG/2ML IJ SOLN
INTRAMUSCULAR | Status: DC | PRN
  Administered 2016-09-21: 50 ug via INTRAVENOUS
  Administered 2016-09-21 (×2): 25 ug via INTRAVENOUS
  Administered 2016-09-21: 100 ug via INTRAVENOUS
  Administered 2016-09-21 (×4): 50 ug via INTRAVENOUS

## 2016-09-21 MED ORDER — GABAPENTIN 300 MG PO CAPS
300.0000 mg | ORAL_CAPSULE | Freq: Three times a day (TID) | ORAL | Status: DC
  Administered 2016-09-21 – 2016-09-22 (×2): 300 mg via ORAL
  Filled 2016-09-21 (×2): qty 1

## 2016-09-21 MED ORDER — MENTHOL 3 MG MT LOZG: 1.0000 | LOZENGE | OROMUCOSAL | Status: DC | PRN

## 2016-09-21 MED ORDER — ACETAMINOPHEN 500 MG PO TABS: 500.0000 mg | ORAL_TABLET | Freq: Four times a day (QID) | ORAL | Status: DC | PRN

## 2016-09-21 MED ORDER — LIDOCAINE-EPINEPHRINE 0.5 %-1:200000 IJ SOLN
INTRAMUSCULAR | Status: DC | PRN
  Administered 2016-09-21: 10 mL

## 2016-09-21 MED ORDER — MEPERIDINE HCL 25 MG/ML IJ SOLN: 6.2500 mg | INTRAMUSCULAR | Status: DC | PRN

## 2016-09-21 MED ORDER — ONDANSETRON HCL 4 MG/2ML IJ SOLN
INTRAMUSCULAR | Status: AC
  Filled 2016-09-21: qty 2

## 2016-09-21 MED ORDER — BACITRACIN ZINC 500 UNIT/GM EX OINT
TOPICAL_OINTMENT | CUTANEOUS | Status: AC
  Filled 2016-09-21: qty 28.35

## 2016-09-21 MED ORDER — HEMOSTATIC AGENTS (NO CHARGE) OPTIME
TOPICAL | Status: DC | PRN
  Administered 2016-09-21: 1 via TOPICAL

## 2016-09-21 MED ORDER — ONDANSETRON HCL 4 MG/2ML IJ SOLN
INTRAMUSCULAR | Status: DC | PRN
  Administered 2016-09-21: 4 mg via INTRAVENOUS

## 2016-09-21 MED ORDER — METHYLPREDNISOLONE ACETATE 80 MG/ML IJ SUSP
INTRAMUSCULAR | Status: DC | PRN
  Administered 2016-09-21: 80 mg

## 2016-09-21 MED ORDER — KETOROLAC TROMETHAMINE 15 MG/ML IJ SOLN
15.0000 mg | Freq: Four times a day (QID) | INTRAMUSCULAR | Status: DC
  Administered 2016-09-21 – 2016-09-22 (×2): 15 mg via INTRAVENOUS
  Filled 2016-09-21 (×2): qty 1

## 2016-09-21 MED ORDER — EPHEDRINE 5 MG/ML INJ
INTRAVENOUS | Status: AC
  Filled 2016-09-21: qty 10

## 2016-09-21 MED ORDER — THROMBIN 5000 UNITS EX SOLR
CUTANEOUS | Status: DC | PRN
  Administered 2016-09-21 (×2): 5000 [IU] via TOPICAL

## 2016-09-21 MED ORDER — ONDANSETRON HCL 4 MG PO TABS: 4.0000 mg | ORAL_TABLET | Freq: Four times a day (QID) | ORAL | Status: DC | PRN

## 2016-09-21 MED ORDER — SODIUM CHLORIDE 0.9% FLUSH: 3.0000 mL | INTRAVENOUS | Status: DC | PRN

## 2016-09-21 MED ORDER — ISOSORB DINITRATE-HYDRALAZINE 20-37.5 MG PO TABS
1.0000 | ORAL_TABLET | Freq: Three times a day (TID) | ORAL | Status: DC
  Administered 2016-09-21 – 2016-09-22 (×2): 1 via ORAL
  Filled 2016-09-21 (×4): qty 1

## 2016-09-21 MED ORDER — ONDANSETRON HCL 4 MG/2ML IJ SOLN: 4.0000 mg | Freq: Four times a day (QID) | INTRAMUSCULAR | Status: DC | PRN

## 2016-09-21 MED ORDER — BACITRACIN ZINC 500 UNIT/GM EX OINT
TOPICAL_OINTMENT | CUTANEOUS | Status: DC | PRN
  Administered 2016-09-21: 1 via TOPICAL

## 2016-09-21 MED ORDER — LOSARTAN POTASSIUM 50 MG PO TABS
50.0000 mg | ORAL_TABLET | Freq: Every day | ORAL | Status: DC
  Administered 2016-09-21: 50 mg via ORAL
  Filled 2016-09-21: qty 1

## 2016-09-21 MED ORDER — POTASSIUM CHLORIDE CRYS ER 10 MEQ PO TBCR
10.0000 meq | EXTENDED_RELEASE_TABLET | Freq: Every day | ORAL | Status: DC
  Administered 2016-09-22: 10 meq via ORAL
  Filled 2016-09-21: qty 1

## 2016-09-21 MED ORDER — CEFAZOLIN SODIUM-DEXTROSE 2-4 GM/100ML-% IV SOLN
2.0000 g | INTRAVENOUS | Status: AC
  Administered 2016-09-21: 2 g via INTRAVENOUS

## 2016-09-21 SURGICAL SUPPLY — 82 items
BAG DECANTER FOR FLEXI CONT (MISCELLANEOUS) ×3 IMPLANT
BENZOIN TINCTURE PRP APPL 2/3 (GAUZE/BANDAGES/DRESSINGS) ×3 IMPLANT
BIT DRILL NEURO 2X3.1 SFT TUCH (MISCELLANEOUS) IMPLANT
BLADE CLIPPER SURG (BLADE) IMPLANT
BLADE ULTRA TIP 2M (BLADE) IMPLANT
BUR MATCHSTICK NEURO 3.0 LAGG (BURR) IMPLANT
CANISTER SUCT 3000ML PPV (MISCELLANEOUS) ×3 IMPLANT
CARTRIDGE OIL MAESTRO DRILL (MISCELLANEOUS) ×1 IMPLANT
CLOSURE WOUND 1/2 X4 (GAUZE/BANDAGES/DRESSINGS) ×1
DECANTER SPIKE VIAL GLASS SM (MISCELLANEOUS) ×3 IMPLANT
DIFFUSER DRILL AIR PNEUMATIC (MISCELLANEOUS) ×3 IMPLANT
DRAPE C-ARM 42X72 X-RAY (DRAPES) ×3 IMPLANT
DRAPE LAPAROTOMY 100X72 PEDS (DRAPES) ×3 IMPLANT
DRAPE MICROSCOPE LEICA (MISCELLANEOUS) ×3 IMPLANT
DRAPE POUCH INSTRU U-SHP 10X18 (DRAPES) ×3 IMPLANT
DRILL NEURO 2X3.1 SOFT TOUCH (MISCELLANEOUS)
DRSG OPSITE POSTOP 4X6 (GAUZE/BANDAGES/DRESSINGS) ×3 IMPLANT
DURAPREP 6ML APPLICATOR 50/CS (WOUND CARE) ×3 IMPLANT
ELECT BLADE 4.0 EZ CLEAN MEGAD (MISCELLANEOUS) ×3
ELECT REM PT RETURN 9FT ADLT (ELECTROSURGICAL) ×3
ELECTRODE BLDE 4.0 EZ CLN MEGD (MISCELLANEOUS) ×1 IMPLANT
ELECTRODE REM PT RTRN 9FT ADLT (ELECTROSURGICAL) ×1 IMPLANT
GAUZE SPONGE 4X4 16PLY XRAY LF (GAUZE/BANDAGES/DRESSINGS) IMPLANT
GLOVE BIO SURGEON STRL SZ 6.5 (GLOVE) ×6 IMPLANT
GLOVE BIO SURGEON STRL SZ7 (GLOVE) IMPLANT
GLOVE BIO SURGEON STRL SZ7.5 (GLOVE) IMPLANT
GLOVE BIO SURGEON STRL SZ8 (GLOVE) IMPLANT
GLOVE BIO SURGEON STRL SZ8.5 (GLOVE) IMPLANT
GLOVE BIO SURGEONS STRL SZ 6.5 (GLOVE) ×3
GLOVE BIOGEL M 8.0 STRL (GLOVE) IMPLANT
GLOVE BIOGEL PI IND STRL 6.5 (GLOVE) ×1 IMPLANT
GLOVE BIOGEL PI IND STRL 8 (GLOVE) ×1 IMPLANT
GLOVE BIOGEL PI INDICATOR 6.5 (GLOVE) ×2
GLOVE BIOGEL PI INDICATOR 8 (GLOVE) ×2
GLOVE ECLIPSE 6.5 STRL STRAW (GLOVE) ×3 IMPLANT
GLOVE ECLIPSE 7.0 STRL STRAW (GLOVE) IMPLANT
GLOVE ECLIPSE 7.5 STRL STRAW (GLOVE) ×3 IMPLANT
GLOVE ECLIPSE 8.0 STRL XLNG CF (GLOVE) IMPLANT
GLOVE ECLIPSE 8.5 STRL (GLOVE) IMPLANT
GLOVE EXAM NITRILE LRG STRL (GLOVE) IMPLANT
GLOVE EXAM NITRILE XL STR (GLOVE) IMPLANT
GLOVE EXAM NITRILE XS STR PU (GLOVE) IMPLANT
GLOVE INDICATOR 6.5 STRL GRN (GLOVE) IMPLANT
GLOVE INDICATOR 7.0 STRL GRN (GLOVE) IMPLANT
GLOVE INDICATOR 7.5 STRL GRN (GLOVE) IMPLANT
GLOVE INDICATOR 8.0 STRL GRN (GLOVE) IMPLANT
GLOVE INDICATOR 8.5 STRL (GLOVE) IMPLANT
GLOVE OPTIFIT SS 8.0 STRL (GLOVE) IMPLANT
GLOVE SURG SS PI 6.5 STRL IVOR (GLOVE) IMPLANT
GLOVE SURG SS PI 7.5 STRL IVOR (GLOVE) ×6 IMPLANT
GOWN STRL REUS W/ TWL LRG LVL3 (GOWN DISPOSABLE) ×4 IMPLANT
GOWN STRL REUS W/ TWL XL LVL3 (GOWN DISPOSABLE) ×1 IMPLANT
GOWN STRL REUS W/TWL 2XL LVL3 (GOWN DISPOSABLE) IMPLANT
GOWN STRL REUS W/TWL LRG LVL3 (GOWN DISPOSABLE) ×8
GOWN STRL REUS W/TWL XL LVL3 (GOWN DISPOSABLE) ×2
HEMOSTAT POWDER SURGIFOAM 1G (HEMOSTASIS) ×3 IMPLANT
KIT BASIN OR (CUSTOM PROCEDURE TRAY) ×3 IMPLANT
KIT ROOM TURNOVER OR (KITS) ×3 IMPLANT
MARKER SKIN DUAL TIP RULER LAB (MISCELLANEOUS) ×3 IMPLANT
NEEDLE HYPO 25X1 1.5 SAFETY (NEEDLE) ×3 IMPLANT
NEEDLE SPNL 18GX3.5 QUINCKE PK (NEEDLE) ×3 IMPLANT
NS IRRIG 1000ML POUR BTL (IV SOLUTION) ×3 IMPLANT
OIL CARTRIDGE MAESTRO DRILL (MISCELLANEOUS) ×3
PACK LAMINECTOMY NEURO (CUSTOM PROCEDURE TRAY) ×3 IMPLANT
PATTIES SURGICAL .25X.25 (GAUZE/BANDAGES/DRESSINGS) IMPLANT
RUBBERBAND STERILE (MISCELLANEOUS) ×6 IMPLANT
SPONGE SURGIFOAM ABS GEL SZ50 (HEMOSTASIS) ×3 IMPLANT
STAPLER SKIN PROX WIDE 3.9 (STAPLE) ×3 IMPLANT
STRIP CLOSURE SKIN 1/2X4 (GAUZE/BANDAGES/DRESSINGS) ×2 IMPLANT
SUT ETHILON 3 0 FSL (SUTURE) IMPLANT
SUT ETHILON 3 0 PS 1 (SUTURE) ×3 IMPLANT
SUT VIC AB 0 CT1 18XCR BRD8 (SUTURE) ×1 IMPLANT
SUT VIC AB 0 CT1 8-18 (SUTURE) ×2
SUT VIC AB 2-0 CT1 18 (SUTURE) ×3 IMPLANT
SUT VIC AB 3-0 FS2 27 (SUTURE) ×3 IMPLANT
SUT VIC AB 3-0 SH 8-18 (SUTURE) ×3 IMPLANT
SYR 3ML LL SCALE MARK (SYRINGE) ×3 IMPLANT
SYR BULB 3OZ (MISCELLANEOUS) ×3 IMPLANT
TOWEL GREEN STERILE (TOWEL DISPOSABLE) ×3 IMPLANT
TOWEL GREEN STERILE FF (TOWEL DISPOSABLE) ×3 IMPLANT
UNDERPAD 30X30 (UNDERPADS AND DIAPERS) ×3 IMPLANT
WATER STERILE IRR 1000ML POUR (IV SOLUTION) ×3 IMPLANT

## 2016-09-21 NOTE — Op Note (Signed)
09/21/2016  6:10 PM  PATIENT:  Raymond [facility]a  51 y.o. 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 He  was positioned with his head supported by a horseshoe supine on the operating room table. All pressure points were properly padded. His cervicothoracic region was prepped and draped in a sterile manner. I infiltrated lidocaine into the planned incision. I opened the skin with a 10 blade, and dissected sharply on the right side to expose the lamina of C7 and T1. I used fluoroscopy to localize. Dr. Newell Coral and I performed a laminotomy to expose the C8 nerve root. Using microdissection we used various tools to explore the area around and anterior to the C8 root. We were not able to express a disc fragement, but fully decompressed the nerve root. I infiltrated the operative site with depomedrol. We had good hemostasis prior to closure. I approximated the nuchal ligament, subcutaneous and subcuticular tissue with vicryl sutures. I used a running nylon to approximated the dermal edges. I applied a sterile dressing. He was awakened , extubated and taken to the Pacu moving all extremities.   PLAN OF CARE: Admit for overnight observation  PATIENT DISPOSITION:  PACU - hemodynamically stable.   Delay start of Pharmacological VTE agent (>24hrs) due to  surgical blood loss or risk of bleeding:  yes

## 2016-09-21 NOTE — Anesthesia Procedure Notes (Signed)
Procedure Name: Intubation Date/Time: 09/21/2016 1:15 PM Performed by: Adonis Housekeeper Pre-anesthesia Checklist: Patient identified, Emergency Drugs available, Suction available and Patient being monitored Patient Re-evaluated:Patient Re-evaluated prior to induction Oxygen Delivery Method: Circle system utilized Preoxygenation: Pre-oxygenation with 100% oxygen Induction Type: IV induction Ventilation: Mask ventilation with difficulty and Oral airway inserted - appropriate to patient size Laryngoscope Size: Hyacinth Meeker and 2 Grade View: Grade I Tube type: Oral Tube size: 7.5 mm Number of attempts: 1 Airway Equipment and Method: Stylet Placement Confirmation: ETT inserted through vocal cords under direct vision,  positive ETCO2 and breath sounds checked- equal and bilateral Secured at: 23 cm Tube secured with: Tape Dental Injury: Teeth and Oropharynx as per pre-operative assessment

## 2016-09-21 NOTE — H&P (Signed)
BP 137/79   Pulse 74   Temp 97.8 F (36.6 C) (Oral)   Resp 20   Ht  (1.727 m)   Wt 87.5 kg (193 lb)   SpO2 100%   BMI 29.35 kg/m   Raymond [facility] is a former patient of Dr. Thurmon Fair. He took him to the operating room in 2011 for an ACDF at C6-7. He used a Mystique plate, which is a resorbable plate. He does have a solid fusion there. He went to the ED about a week ago and had a CT scan of the cervical spine. He says a few months ago, he started having pain in the neck posteriorly, which would radiate into the right upper extremity and into the palm of his hand and especially to the 4th and 5th digits. He feels the numbness in his right upper extremity. He also feels weakness in his right upper extremity. He says his grip is not as good as it should be and he is just having problems moving things around. He works in [employer] and has always considered himself a very strong man. He says it is a chore just for him to get his arm up so he can shave. He says his arm does feel similar to what it did like in 2011 before his operation, but it may be a little bit worse. Height 5 feet 8 inches, weight 197.40 pounds. Temperature is 98.3, blood pressure is 187/137, pulse is 76. Pain is 8/10. He does state that his blood pressure had been well controlled until he started feeling pain in the upper extremity, which is about 2 months. He is 51 years of age, weighs 197.40 pounds. He is currently disabled. He is right handed. No history of substance abuse. He does drink alcohol socially. Medical history includes hypertension. He takes Klor-Con, Losartan, and Metoprolol. Mother is 63 in good health. Father is 63. He says he is deceased; I believe the word he used is "transitioned." He says symptoms started April 16, 2016. He said he heard something pop that day. He says now it is to the point where he is not able to eat well. He has lost about 5 pounds because he is nauseated secondary to the pain. REVIEW OF  SYSTEMS: Positive for nausea, sinus problems, neck pain, arm weakness, arm pain. EXAM: He is alert, oriented by 4 and answers all questions appropriately. Memory, language, attention span, and fund of knowledge are normal. Speech is clear. It is also fluent. Tongue and uvula in the midline. Shoulder shrug is normal. Hearing intact to voice. Uvula elevates in midline. Tongue protrudes in the midline. Upper extremity strength on manual exam is normal. He has some slight weakness in his grip, maybe some slight weakness in his ulnar innervated musculature C6-7. But I am unable to detect weakness in the triceps, in the biceps, in the deltoids on either side. He has intact proprioception. Intact light touch. Gait is normal. Muscle tone, bulk, coordination are normal.  CT shows a solid fusion at C6-7 [facility] returns with a post myelogram CT confirming the right foraminal disc herniation at C7-T1. I don't think I need to do an anterior approach. I think I can get this from a posterior approach to remove the disc obviating the need for a fusion. I explained this to Raymond [facility] also explained that this is typically a much more painful approach because you do have to dissect through the musculature. Risks and benefits, bleeding, infection, no relief,  damage to the spinal cord, paralysis, weakness in one or both upper extremities, bowel, bladder dysfunction, need for further surgery, disc recurrence were all discussed. He understands, wishes to proceed. He is alert, oriented by 4, answers all questions appropriately. Memory, language, attention span, and fund of knowledge are normal. Speech is clear and fluent.

## 2016-09-21 NOTE — Anesthesia Procedure Notes (Deleted)
Performed by: Kerwin Augustus M       

## 2016-09-21 NOTE — Transfer of Care (Signed)
Immediate Anesthesia Transfer of Care Note  Patient: Raymond [facility]a  Procedure(s) Performed: Procedure(s): LAMINECTOMY CERVICAL SEVEN- THORACIC ONE RIGHT (Right)  Patient Location: PACU  Anesthesia Type:General  Level of Consciousness: awake, alert , oriented and patient cooperative  Airway & Oxygen Therapy: Patient Spontanous Breathing and Patient connected to nasal cannula oxygen  Post-op Assessment: Report given to RN and Post -op Vital signs reviewed and stable  Post vital signs: Reviewed and stable  Last Vitals:  Vitals:   09/21/16 1555 09/21/16 1600  BP: (P) 107/80   Pulse: (P) 84 76  Resp: (!) (P) 23 (!) 23  Temp: (P) 36.5 C   SpO2: (P) 100% 96%    Last Pain:  Vitals:   09/21/16 1555  TempSrc:   PainSc: (P) 0-No pain         Complications: No apparent anesthesia complications

## 2016-09-21 NOTE — Anesthesia Preprocedure Evaluation (Signed)
Anesthesia Evaluation  Patient identified by MRN, date of birth, ID band Patient awake    Reviewed: Allergy & Precautions, NPO status , Patient's Chart, lab work & pertinent test results  Airway Mallampati: I       Dental no notable dental hx. (+) Teeth Intact   Pulmonary Current Smoker,    Pulmonary exam normal breath sounds clear to auscultation       Cardiovascular hypertension, Pt. on medications and Pt. on home beta blockers Normal cardiovascular exam Rhythm:Regular Rate:Normal     Neuro/Psych negative psych ROS   GI/Hepatic negative GI ROS, Neg liver ROS,   Endo/Other  negative endocrine ROS  Renal/GU negative Renal ROS  negative genitourinary   Musculoskeletal   Abdominal Normal abdominal exam  (+)   Peds  Hematology negative hematology ROS (+)   Anesthesia Other Findings   Reproductive/Obstetrics                             Anesthesia Physical Anesthesia Plan  ASA: II  Anesthesia Plan: General   Post-op Pain Management:    Induction: Intravenous  PONV Risk Score and Plan: 2 and Ondansetron and Dexamethasone  Airway Management Planned: Oral ETT  Additional Equipment:   Intra-op Plan:   Post-operative Plan: Extubation in OR  Informed Consent: I have reviewed the patients History and Physical, chart, labs and discussed the procedure including the risks, benefits and alternatives for the proposed anesthesia with the patient or authorized representative who has indicated his/her understanding and acceptance.   Dental advisory given  Plan Discussed with: CRNA and Surgeon  Anesthesia Plan Comments:         Anesthesia Quick Evaluation

## 2016-09-22 ENCOUNTER — Encounter (HOSPITAL_COMMUNITY): Payer: Self-pay | Admitting: Neurosurgery

## 2016-09-22 DIAGNOSIS — M5013 Cervical disc disorder with radiculopathy, cervicothoracic region: Secondary | ICD-10-CM | POA: Diagnosis not present

## 2016-09-22 MED ORDER — IBUPROFEN 800 MG PO TABS: 800.0000 mg | ORAL_TABLET | Freq: Three times a day (TID) | ORAL | 0 refills | Status: AC | PRN

## 2016-09-22 MED ORDER — HYDROCODONE-ACETAMINOPHEN 5-325 MG PO TABS: 1.0000 | ORAL_TABLET | Freq: Four times a day (QID) | ORAL | 0 refills | Status: AC | PRN

## 2016-09-22 NOTE — Discharge Instructions (Signed)
Call if your temperature is greater than 101.5 Call if your wound becomes very red/tender/drains pus You may shower today Avoid heavy weights, greater than 5lbs for now You may move your neck normally, no need to exercise it.

## 2016-09-22 NOTE — Progress Notes (Signed)
Patient alert and oriented, mae's well, voiding adequate amount of urine, swallowing without difficulty, no c/o pain at time of discharge. Patient discharged home with family. Script and discharged instructions given to patient. Patient and family stated understanding of instructions given. Patient has an appointment with Dr. Cabbell   

## 2016-09-22 NOTE — Discharge Summary (Signed)
Physician Discharge Summary  Patient ID: Raymond Valenzuela MRN: [id number] DOB/AGE: 50-Feb-1967 50 y.o.  Admit date: 09/21/2016 Discharge date: 09/22/2016  Admission Diagnoses:hnp Cervical right C7/T1  Discharge Diagnoses: same Active Problems:   HNP (herniated nucleus pulposus), cervical   Discharged Condition: good  Hospital Course: Mr. Raymond [facility]a was admitted and taken to the OR for an uncomplicated cervical laminectomy for decompression of the right C8 nerve root. Post op he is moving all extremities well, still has numbness in his 4th and 5th digits on his right hand. Wound is clean, dry, and without signs of infection.   Treatments: surgery: LAMINECTOMY CERVICAL SEVEN- THORACIC ONE RIGHT   Discharge Exam: Blood pressure (!) 148/91, pulse 93, temperature 98.8 F (37.1 C), temperature source Oral, resp. rate 18, height  (1.727 m), weight 87.5 kg (193 lb), SpO2 97 %. General appearance: alert, cooperative, appears stated age and no distress Neurologic: Alert and oriented X 3, normal strength and tone. Normal symmetric reflexes. Normal coordination and gait  Disposition: 01-Home or Self Care BRACHIAL NEURIT OR RADICULIT DUE TO DISPLACE OF CERVIC INTERVERT DISC  Allergies as of 09/22/2016      Reactions   Lisinopril Cough      Medication List    TAKE these medications   acetaminophen 500 MG tablet Commonly known as:  TYLENOL Take 500 mg by mouth every 6 (six) hours as needed (for headaches or pain).   amLODipine 10 MG tablet Commonly known as:  NORVASC Take 10 mg by mouth daily.   chlorthalidone 25 MG tablet Commonly known as:  HYGROTON Take 25 mg by mouth daily.   HYDROcodone-acetaminophen 5-325 MG tablet Commonly known as:  NORCO/VICODIN Take 1 tablet by mouth every 6 (six) hours as needed for moderate pain.   ibuprofen 800 MG tablet Commonly known as:  ADVIL,MOTRIN Take 1 tablet (800 mg total) by mouth every 8 (eight) hours as needed.    isosorbide-hydrALAZINE 20-37.5 MG tablet Commonly known as:  BIDIL Take 1 tablet by mouth 3 (three) times daily.   losartan 50 MG tablet Commonly known as:  COZAAR Take 50 mg by mouth daily.   metoprolol succinate 100 MG 24 hr tablet Commonly known as:  TOPROL-XL Take 100 mg by mouth daily.   potassium chloride 10 MEQ tablet Commonly known as:  K-DUR,KLOR-CON Take 10 mEq by mouth daily.            Discharge Care Instructions        Start     Ordered   09/22/16 0000  HYDROcodone-acetaminophen (NORCO/VICODIN) 5-325 MG tablet  Every 6 hours PRN     09/22/16 0859   09/22/16 0000  ibuprofen (ADVIL,MOTRIN) 800 MG tablet  Every 8 hours PRN     09/22/16 0859     Follow-up Information    Coletta Memos, MD Follow up in 10 day(s).   Specialty:  Neurosurgery Why:  please call the office to make an appointment to remove your sutures Contact information: [address] [place] [address] [phone]           Signed: Carmela Hurt 09/22/2016, 9:02 AM

## 2016-10-03 NOTE — Anesthesia Postprocedure Evaluation (Addendum)
Anesthesia Post Note  Patient: Raymond [facility]a  Procedure(s) Performed: LAMINECTOMY CERVICAL SEVEN- THORACIC ONE RIGHT (Right Back)     Patient location during evaluation: PACU Anesthesia Type: General Level of consciousness: awake and sedated Pain management: pain level controlled Respiratory status: spontaneous breathing Cardiovascular status: stable Postop Assessment: no apparent nausea or vomiting Anesthetic complications: no    Last Vitals:  Vitals:   09/22/16 0400 09/22/16 0807  BP: 125/73 (!) 148/91  Pulse: 87 93  Resp: 20 18  Temp: 37.2 C 37.1 C  SpO2: 95% 97%    Last Pain:  Vitals:   09/22/16 0807  TempSrc: Oral  PainSc:    Pain Goal: Patients Stated Pain Goal: 2 (09/22/16 0641)               Clotile Whittington JR,JOHN Susann Givens

## 2017-11-20 ENCOUNTER — Other Ambulatory Visit: Payer: Self-pay

## 2017-11-20 ENCOUNTER — Emergency Department (HOSPITAL_COMMUNITY): Payer: No Typology Code available for payment source

## 2017-11-20 ENCOUNTER — Emergency Department (HOSPITAL_COMMUNITY)
Admission: EM | Admit: 2017-11-20 | Discharge: 2017-11-20 | Disposition: A | Discharge status: Home / Self Care | Payer: No Typology Code available for payment source | Attending: Emergency Medicine | Admitting: Emergency Medicine

## 2017-11-20 ENCOUNTER — Encounter (HOSPITAL_COMMUNITY): Payer: Self-pay | Admitting: Emergency Medicine

## 2017-11-20 DIAGNOSIS — S8002XA Contusion of left knee, initial encounter: Secondary | ICD-10-CM | POA: Diagnosis not present

## 2017-11-20 DIAGNOSIS — F17200 Nicotine dependence, unspecified, uncomplicated: Secondary | ICD-10-CM | POA: Diagnosis not present

## 2017-11-20 DIAGNOSIS — I10 Essential (primary) hypertension: Secondary | ICD-10-CM | POA: Insufficient documentation

## 2017-11-20 DIAGNOSIS — Y999 Unspecified external cause status: Secondary | ICD-10-CM | POA: Diagnosis not present

## 2017-11-20 DIAGNOSIS — Y9389 Activity, other specified: Secondary | ICD-10-CM | POA: Diagnosis not present

## 2017-11-20 DIAGNOSIS — Y92410 Unspecified street and highway as the place of occurrence of the external cause: Secondary | ICD-10-CM | POA: Insufficient documentation

## 2017-11-20 DIAGNOSIS — Z79899 Other long term (current) drug therapy: Secondary | ICD-10-CM | POA: Insufficient documentation

## 2017-11-20 DIAGNOSIS — M542 Cervicalgia: Secondary | ICD-10-CM | POA: Diagnosis not present

## 2017-11-20 DIAGNOSIS — M62830 Muscle spasm of back: Secondary | ICD-10-CM | POA: Diagnosis not present

## 2017-11-20 DIAGNOSIS — S6391XA Sprain of unspecified part of right wrist and hand, initial encounter: Secondary | ICD-10-CM

## 2017-11-20 DIAGNOSIS — S6991XA Unspecified injury of right wrist, hand and finger(s), initial encounter: Secondary | ICD-10-CM | POA: Diagnosis present

## 2017-11-20 MED ORDER — CYCLOBENZAPRINE HCL 10 MG PO TABS: 10.0000 mg | ORAL_TABLET | Freq: Two times a day (BID) | ORAL | 0 refills | Status: AC | PRN

## 2017-11-20 MED ORDER — CYCLOBENZAPRINE HCL 10 MG PO TABS
10.0000 mg | ORAL_TABLET | Freq: Once | ORAL | Status: AC
  Administered 2017-11-20: 10 mg via ORAL
  Filled 2017-11-20: qty 1
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
## 2017-11-20 NOTE — Discharge Instructions (Signed)
You may take tylenol in addition to the medication we give you. Wear the splint a and knee brace for comfort. Follow up with your doctor for your blood pressure. Start taking your medication.  Return here as needed.

## 2017-11-20 NOTE — ED Notes (Signed)
Pt ambulated to bathroom 
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
## 2017-12-04 ENCOUNTER — Emergency Department (HOSPITAL_COMMUNITY)
Admission: EM | Admit: 2017-12-04 | Discharge: 2017-12-04 | Disposition: A | Discharge status: Home / Self Care | Payer: No Typology Code available for payment source | Attending: Emergency Medicine | Admitting: Emergency Medicine

## 2017-12-04 ENCOUNTER — Encounter (HOSPITAL_COMMUNITY): Payer: Self-pay

## 2017-12-04 ENCOUNTER — Emergency Department (HOSPITAL_COMMUNITY): Payer: No Typology Code available for payment source

## 2017-12-04 DIAGNOSIS — Y939 Activity, unspecified: Secondary | ICD-10-CM | POA: Insufficient documentation

## 2017-12-04 DIAGNOSIS — F17200 Nicotine dependence, unspecified, uncomplicated: Secondary | ICD-10-CM | POA: Insufficient documentation

## 2017-12-04 DIAGNOSIS — Z79899 Other long term (current) drug therapy: Secondary | ICD-10-CM | POA: Diagnosis not present

## 2017-12-04 DIAGNOSIS — M79641 Pain in right hand: Secondary | ICD-10-CM | POA: Insufficient documentation

## 2017-12-04 DIAGNOSIS — Y998 Other external cause status: Secondary | ICD-10-CM | POA: Insufficient documentation

## 2017-12-04 DIAGNOSIS — I10 Essential (primary) hypertension: Secondary | ICD-10-CM | POA: Insufficient documentation

## 2017-12-04 DIAGNOSIS — Y92410 Unspecified street and highway as the place of occurrence of the external cause: Secondary | ICD-10-CM | POA: Diagnosis not present
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
## 2017-12-04 NOTE — Discharge Instructions (Addendum)
Please read attached information. If you experience any new or worsening signs or symptoms please return to the emergency room for evaluation. Please follow-up with your primary care provider or specialist as discussed.  °
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
## 2017-12-04 NOTE — ED Notes (Signed)
Discharge instructions discussed with Pt. Pt verbalized understanding. Pt stable and ambulatory.    

## 2017-12-05 NOTE — Progress Notes (Signed)
[facility] CSW received phone call from pt with questions about prescriptions. [facility]. [facility] will follow up with pt.   Montine CircleKelsy Kanitra Purifoy, Silverio LayLCSWA [facility]  [phone]
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
